# Patient Record
Sex: Male | Born: 1935
Health system: Southern US, Community
[De-identification: ages and names within clinical notes are randomized; demographics above are authoritative.]

## PROBLEM LIST (undated history)

## (undated) DIAGNOSIS — Z8546 Personal history of malignant neoplasm of prostate: Secondary | ICD-10-CM

## (undated) DIAGNOSIS — H9190 Unspecified hearing loss, unspecified ear: Secondary | ICD-10-CM

## (undated) DIAGNOSIS — E785 Hyperlipidemia, unspecified: Secondary | ICD-10-CM

## (undated) DIAGNOSIS — Z8673 Personal history of transient ischemic attack (TIA), and cerebral infarction without residual deficits: Secondary | ICD-10-CM

## (undated) DIAGNOSIS — Z8679 Personal history of other diseases of the circulatory system: Secondary | ICD-10-CM

## (undated) DIAGNOSIS — F419 Anxiety disorder, unspecified: Secondary | ICD-10-CM

## (undated) DIAGNOSIS — I10 Essential (primary) hypertension: Secondary | ICD-10-CM

## (undated) DIAGNOSIS — E790 Hyperuricemia without signs of inflammatory arthritis and tophaceous disease: Secondary | ICD-10-CM

## (undated) HISTORY — PX: OTHER SURGICAL HISTORY: SHX169

## (undated) HISTORY — PX: BACK SURGERY: SHX140

## (undated) HISTORY — PX: EVALUATION UNDER ANESTHESIA WITH HEMORRHOIDECTOMY: SHX5624

## (undated) HISTORY — DX: Hyperlipidemia, unspecified: E78.5

## (undated) HISTORY — PX: CHOLECYSTECTOMY: SHX55

## (undated) HISTORY — PX: PERINEAL PROSTATECTOMY: SUR1054

## (undated) HISTORY — PX: HERNIA REPAIR: SHX51

---

## 2006-05-16 ENCOUNTER — Ambulatory Visit: Payer: Self-pay | Admitting: Internal Medicine

## 2009-11-10 ENCOUNTER — Ambulatory Visit: Payer: Self-pay | Admitting: Gastroenterology

## 2009-11-12 LAB — PATHOLOGY REPORT

## 2011-08-08 ENCOUNTER — Emergency Department: Payer: Self-pay | Admitting: Emergency Medicine

## 2011-08-08 ENCOUNTER — Ambulatory Visit: Payer: Self-pay | Admitting: Internal Medicine

## 2011-08-08 LAB — PROTIME-INR
INR: 1
Prothrombin Time: 13.8 secs (ref 11.5–14.7)

## 2011-08-08 LAB — COMPREHENSIVE METABOLIC PANEL
Anion Gap: 9 (ref 7–16)
BUN: 16 mg/dL (ref 7–18)
Bilirubin,Total: 1.1 mg/dL — ABNORMAL HIGH (ref 0.2–1.0)
Calcium, Total: 9.3 mg/dL (ref 8.5–10.1)
Creatinine: 1.2 mg/dL (ref 0.60–1.30)
Glucose: 96 mg/dL (ref 65–99)
Osmolality: 277 (ref 275–301)
SGOT(AST): 36 U/L (ref 15–37)
SGPT (ALT): 30 U/L
Sodium: 138 mmol/L (ref 136–145)
Total Protein: 8.5 g/dL — ABNORMAL HIGH (ref 6.4–8.2)

## 2011-08-08 LAB — CBC
HCT: 52.4 % — ABNORMAL HIGH (ref 40.0–52.0)
MCH: 30.3 pg (ref 26.0–34.0)
MCV: 90 fL (ref 80–100)
Platelet: 193 10*3/uL (ref 150–440)
RBC: 5.8 10*6/uL (ref 4.40–5.90)
RDW: 13.2 % (ref 11.5–14.5)

## 2012-07-14 ENCOUNTER — Observation Stay: Payer: Self-pay | Admitting: Internal Medicine

## 2012-07-14 LAB — COMPREHENSIVE METABOLIC PANEL
Albumin: 3.4 g/dL (ref 3.4–5.0)
Alkaline Phosphatase: 58 U/L (ref 50–136)
Anion Gap: 8 (ref 7–16)
BUN: 31 mg/dL — ABNORMAL HIGH (ref 7–18)
Co2: 24 mmol/L (ref 21–32)
Creatinine: 1.37 mg/dL — ABNORMAL HIGH (ref 0.60–1.30)
EGFR (Non-African Amer.): 49 — ABNORMAL LOW
Glucose: 137 mg/dL — ABNORMAL HIGH (ref 65–99)
Osmolality: 288 (ref 275–301)
Potassium: 4.1 mmol/L (ref 3.5–5.1)
SGOT(AST): 33 U/L (ref 15–37)
SGPT (ALT): 36 U/L (ref 12–78)
Sodium: 140 mmol/L (ref 136–145)
Total Protein: 7.5 g/dL (ref 6.4–8.2)

## 2012-07-14 LAB — CBC
HCT: 53.7 % — ABNORMAL HIGH (ref 40.0–52.0)
MCH: 29.9 pg (ref 26.0–34.0)
MCV: 90 fL (ref 80–100)
RBC: 5.99 10*6/uL — ABNORMAL HIGH (ref 4.40–5.90)

## 2012-07-14 LAB — URINALYSIS, COMPLETE
Glucose,UR: NEGATIVE mg/dL (ref 0–75)
Ph: 5 (ref 4.5–8.0)
RBC,UR: 3 /HPF (ref 0–5)
Specific Gravity: 1.032 (ref 1.003–1.030)
WBC UR: 2 /HPF (ref 0–5)

## 2012-07-15 LAB — COMPREHENSIVE METABOLIC PANEL
Alkaline Phosphatase: 43 U/L — ABNORMAL LOW (ref 50–136)
Anion Gap: 6 — ABNORMAL LOW (ref 7–16)
Bilirubin,Total: 0.4 mg/dL (ref 0.2–1.0)
Calcium, Total: 7.2 mg/dL — ABNORMAL LOW (ref 8.5–10.1)
Co2: 24 mmol/L (ref 21–32)
Creatinine: 1.19 mg/dL (ref 0.60–1.30)
Glucose: 94 mg/dL (ref 65–99)
SGOT(AST): 29 U/L (ref 15–37)
SGPT (ALT): 26 U/L (ref 12–78)
Sodium: 140 mmol/L (ref 136–145)
Total Protein: 5.6 g/dL — ABNORMAL LOW (ref 6.4–8.2)

## 2012-07-15 LAB — CBC WITH DIFFERENTIAL/PLATELET
Eosinophil %: 0.1 %
HGB: 14.7 g/dL (ref 13.0–18.0)
Lymphocyte #: 1.4 10*3/uL (ref 1.0–3.6)
Lymphocyte %: 31.7 %
MCH: 29.6 pg (ref 26.0–34.0)
MCHC: 33.3 g/dL (ref 32.0–36.0)
Monocyte #: 0.6 x10 3/mm (ref 0.2–1.0)
Monocyte %: 13 %
RBC: 4.98 10*6/uL (ref 4.40–5.90)
WBC: 4.3 10*3/uL (ref 3.8–10.6)

## 2012-09-15 ENCOUNTER — Inpatient Hospital Stay: Payer: Self-pay | Admitting: Surgery

## 2012-09-15 LAB — URINALYSIS, COMPLETE
Glucose,UR: NEGATIVE mg/dL (ref 0–75)
Leukocyte Esterase: NEGATIVE
Nitrite: NEGATIVE
Ph: 5 (ref 4.5–8.0)
Protein: 30
Specific Gravity: 1.028 (ref 1.003–1.030)
Squamous Epithelial: NONE SEEN

## 2012-09-15 LAB — COMPREHENSIVE METABOLIC PANEL
Albumin: 3.3 g/dL — ABNORMAL LOW (ref 3.4–5.0)
Alkaline Phosphatase: 69 U/L (ref 50–136)
Anion Gap: 7 (ref 7–16)
Co2: 24 mmol/L (ref 21–32)
Glucose: 113 mg/dL — ABNORMAL HIGH (ref 65–99)
Osmolality: 277 (ref 275–301)
Potassium: 4 mmol/L (ref 3.5–5.1)
SGOT(AST): 33 U/L (ref 15–37)
Total Protein: 7.8 g/dL (ref 6.4–8.2)

## 2012-09-15 LAB — CBC
HCT: 45.4 % (ref 40.0–52.0)
HGB: 15.1 g/dL (ref 13.0–18.0)
MCHC: 33.2 g/dL (ref 32.0–36.0)
MCV: 88 fL (ref 80–100)
Platelet: 177 10*3/uL (ref 150–440)
RBC: 5.14 10*6/uL (ref 4.40–5.90)
RDW: 13 % (ref 11.5–14.5)
WBC: 13.4 10*3/uL — ABNORMAL HIGH (ref 3.8–10.6)

## 2012-09-15 LAB — LIPASE, BLOOD: Lipase: 145 U/L (ref 73–393)

## 2012-09-16 LAB — CBC WITH DIFFERENTIAL/PLATELET
Basophil %: 0.3 %
Eosinophil #: 0.1 10*3/uL (ref 0.0–0.7)
HGB: 13.4 g/dL (ref 13.0–18.0)
Lymphocyte %: 13.9 %
MCHC: 34.2 g/dL (ref 32.0–36.0)
MCV: 88 fL (ref 80–100)
Monocyte #: 0.8 x10 3/mm (ref 0.2–1.0)
Neutrophil #: 7.2 10*3/uL — ABNORMAL HIGH (ref 1.4–6.5)
Platelet: 154 10*3/uL (ref 150–440)
RBC: 4.47 10*6/uL (ref 4.40–5.90)
RDW: 13.1 % (ref 11.5–14.5)

## 2012-09-16 LAB — BASIC METABOLIC PANEL
Anion Gap: 8 (ref 7–16)
BUN: 22 mg/dL — ABNORMAL HIGH (ref 7–18)
Chloride: 107 mmol/L (ref 98–107)
Creatinine: 1.33 mg/dL — ABNORMAL HIGH (ref 0.60–1.30)
EGFR (African American): 59 — ABNORMAL LOW
Osmolality: 279 (ref 275–301)
Sodium: 138 mmol/L (ref 136–145)

## 2012-09-17 LAB — COMPREHENSIVE METABOLIC PANEL
Anion Gap: 10 (ref 7–16)
BUN: 13 mg/dL (ref 7–18)
Chloride: 107 mmol/L (ref 98–107)
Creatinine: 1.16 mg/dL (ref 0.60–1.30)
EGFR (African American): >60
EGFR (Non-African Amer.): >60
Glucose: 97 mg/dL (ref 65–99)
Osmolality: 278 (ref 275–301)
Potassium: 4.7 mmol/L (ref 3.5–5.1)
Sodium: 139 mmol/L (ref 136–145)
Total Protein: 6 g/dL — ABNORMAL LOW (ref 6.4–8.2)

## 2012-09-17 LAB — CBC WITH DIFFERENTIAL/PLATELET
Basophil #: 0 10*3/uL (ref 0.0–0.1)
Basophil %: 0.2 %
HCT: 39 % — ABNORMAL LOW (ref 40.0–52.0)
HGB: 13.3 g/dL (ref 13.0–18.0)
Lymphocyte #: 0.7 10*3/uL — ABNORMAL LOW (ref 1.0–3.6)
MCHC: 34.1 g/dL (ref 32.0–36.0)
MCV: 88 fL (ref 80–100)
Neutrophil #: 7.9 10*3/uL — ABNORMAL HIGH (ref 1.4–6.5)
Neutrophil %: 85 %
Platelet: 185 10*3/uL (ref 150–440)
WBC: 9.2 10*3/uL (ref 3.8–10.6)

## 2012-09-19 LAB — BASIC METABOLIC PANEL
Anion Gap: 9 (ref 7–16)
BUN: 13 mg/dL (ref 7–18)
Calcium, Total: 8.7 mg/dL (ref 8.5–10.1)
Chloride: 106 mmol/L (ref 98–107)
Co2: 24 mmol/L (ref 21–32)
Creatinine: 1.08 mg/dL (ref 0.60–1.30)
EGFR (African American): >60
Potassium: 4 mmol/L (ref 3.5–5.1)
Sodium: 139 mmol/L (ref 136–145)

## 2012-09-19 LAB — CBC WITH DIFFERENTIAL/PLATELET
Basophil #: 0 10*3/uL (ref 0.0–0.1)
Basophil %: 0.3 %
Eosinophil %: 3.9 %
Lymphocyte #: 1 10*3/uL (ref 1.0–3.6)
Lymphocyte %: 9.8 %
MCH: 30.1 pg (ref 26.0–34.0)
MCHC: 34.2 g/dL (ref 32.0–36.0)
MCV: 88 fL (ref 80–100)
Neutrophil #: 7.7 10*3/uL — ABNORMAL HIGH (ref 1.4–6.5)
Neutrophil %: 79.1 %
RBC: 4.39 10*6/uL — ABNORMAL LOW (ref 4.40–5.90)
RDW: 13.2 % (ref 11.5–14.5)
WBC: 9.7 10*3/uL (ref 3.8–10.6)

## 2012-09-21 LAB — CULTURE, BLOOD (SINGLE)

## 2012-09-22 LAB — CBC WITH DIFFERENTIAL/PLATELET
Basophil #: 0 10*3/uL (ref 0.0–0.1)
Eosinophil #: 0.2 10*3/uL (ref 0.0–0.7)
Eosinophil %: 2.5 %
HCT: 34.9 % — ABNORMAL LOW (ref 40.0–52.0)
HGB: 11.8 g/dL — ABNORMAL LOW (ref 13.0–18.0)
Lymphocyte %: 12.9 %
Monocyte #: 0.8 x10 3/mm (ref 0.2–1.0)
Monocyte %: 8.5 %
Neutrophil %: 75.8 %
RBC: 4.03 10*6/uL — ABNORMAL LOW (ref 4.40–5.90)
RDW: 13.2 % (ref 11.5–14.5)

## 2012-09-22 LAB — BASIC METABOLIC PANEL
Calcium, Total: 8.4 mg/dL — ABNORMAL LOW (ref 8.5–10.1)
Chloride: 105 mmol/L (ref 98–107)
EGFR (African American): >60
EGFR (Non-African Amer.): >60
Glucose: 133 mg/dL — ABNORMAL HIGH (ref 65–99)
Osmolality: 279 (ref 275–301)
Potassium: 3.3 mmol/L — ABNORMAL LOW (ref 3.5–5.1)

## 2012-09-30 ENCOUNTER — Emergency Department: Payer: Self-pay | Admitting: Emergency Medicine

## 2012-11-25 DIAGNOSIS — E785 Hyperlipidemia, unspecified: Secondary | ICD-10-CM | POA: Insufficient documentation

## 2012-11-25 DIAGNOSIS — I10 Essential (primary) hypertension: Secondary | ICD-10-CM | POA: Insufficient documentation

## 2012-11-25 HISTORY — DX: Hyperlipidemia, unspecified: E78.5

## 2013-01-21 ENCOUNTER — Ambulatory Visit: Payer: Self-pay | Admitting: Surgery

## 2013-01-21 LAB — CREATININE, SERUM
EGFR (African American): >60
EGFR (Non-African Amer.): 56 — ABNORMAL LOW

## 2013-02-12 ENCOUNTER — Ambulatory Visit: Payer: Self-pay | Admitting: Surgery

## 2013-02-12 DIAGNOSIS — I10 Essential (primary) hypertension: Secondary | ICD-10-CM

## 2013-02-12 LAB — BASIC METABOLIC PANEL
Anion Gap: 4 — ABNORMAL LOW (ref 7–16)
Calcium, Total: 9.5 mg/dL (ref 8.5–10.1)
Co2: 27 mmol/L (ref 21–32)
Creatinine: 1.06 mg/dL (ref 0.60–1.30)
EGFR (African American): >60
EGFR (Non-African Amer.): >60
Glucose: 96 mg/dL (ref 65–99)
Osmolality: 279 (ref 275–301)
Sodium: 139 mmol/L (ref 136–145)

## 2013-02-12 LAB — CBC WITH DIFFERENTIAL/PLATELET
Basophil %: 0.5 %
Eosinophil %: 2.2 %
HGB: 16.4 g/dL (ref 13.0–18.0)
Lymphocyte #: 1.7 10*3/uL (ref 1.0–3.6)
Lymphocyte %: 25.2 %
MCHC: 33.9 g/dL (ref 32.0–36.0)
MCV: 88 fL (ref 80–100)
Monocyte %: 7.9 %
Neutrophil #: 4.2 10*3/uL (ref 1.4–6.5)
RDW: 13.9 % (ref 11.5–14.5)
WBC: 6.6 10*3/uL (ref 3.8–10.6)

## 2013-02-19 ENCOUNTER — Ambulatory Visit: Payer: Self-pay | Admitting: Surgery

## 2013-02-20 LAB — PLATELET COUNT: Platelet: 175 10*3/uL (ref 150–440)

## 2013-02-22 LAB — PATHOLOGY REPORT

## 2014-01-07 DIAGNOSIS — Z8546 Personal history of malignant neoplasm of prostate: Secondary | ICD-10-CM | POA: Insufficient documentation

## 2014-01-07 DIAGNOSIS — Z8679 Personal history of other diseases of the circulatory system: Secondary | ICD-10-CM | POA: Insufficient documentation

## 2014-01-07 DIAGNOSIS — Z8673 Personal history of transient ischemic attack (TIA), and cerebral infarction without residual deficits: Secondary | ICD-10-CM | POA: Insufficient documentation

## 2014-05-07 ENCOUNTER — Ambulatory Visit: Payer: Self-pay | Admitting: Surgery

## 2014-05-22 ENCOUNTER — Ambulatory Visit: Payer: Self-pay | Admitting: Surgery

## 2014-07-18 NOTE — Op Note (Signed)
PATIENT NAME:  KARIS, EMIG MR#:  [id number] DATE OF BIRTH:  December 03, 1933  DICTATING PHYSICIAN: Harrell Gave A. Ladean Steinmeyer, MD  DATE OF PROCEDURE:  02/19/2013  PREOPERATIVE DIAGNOSIS: Incisional hernia.   POSTOPERATIVE DIAGNOSIS: Incisional hernia, approximately 5 x 7 cm.   PROCEDURE PERFORMED: Open incisional hernia repair with mesh.   ESTIMATED BLOOD LOSS: 20 mL.   COMPLICATIONS: None.   SPECIMEN: Hernia sac.   IMPLANT: A 10 x 15 piece of Physiomesh.   DRAINS: One JP drain in incisional hernia cavity.   INDICATION FOR SURGERY: Mr. Gilford Rile is a pleasant 81 year old who recently underwent a exploratory laparotomy and small bowel resection for perforated small bowel diverticulitis. He had an uncomfortable palpable reducible mass in his superior aspect of the incision. I thus brought him to the operating room for hernia repair.   DETAILS OF PROCEDURE: As follows: Informed consent was obtained. Mr. Renne was brought to the operating room suite. He was laid supine on the operating table. He was induced, endotracheal tube was placed, general anesthesia was administered. His abdomen was then prepped and draped in standard surgical fashion. A timeout was then performed, correctly identifying the patient name, operative site and procedure to be performed. An incision was made through the superior portion of his previous incision. His hernia sac was soon encountered. It was encircled. It was opened. There were loops of small bowel adhesed to the sac. These were reduced. The sac was then excised. The underside of the fascia was then cleared off, and small flaps were made to allow insertion of Physiomesh. The defect was 5 x 7 cm. A 10 x 15 piece of Physiomesh was placed into the abdomen. It was trimmed at both long ends. These were sutured in place, and the mesh was placed as an underlay with transfascial 0 Prolene sutures interrupted around the circumference of the mesh. The wound was then closed with a  running 0 Prolene. A JP drain was placed in the subcutaneous tissue to prevent seroma formation, and this was sutured in place with a 3-0 Prolene. Staples were then used to complete the wound. A sterile dressing was then placed over the wound. The patient was then awoken, extubated and brought to the postanesthesia care unit. There were no immediate complications. Needle, sponge and instrument counts were correct at the end of the procedure.   ____________________________ Glena Norfolk. Chrislynn Mosely, MD cal:lb D: 02/20/2013 07:41:17 ET T: 02/20/2013 08:14:16 ET JOB#: [id number]  cc: Harrell Gave A. Betzabeth Derringer, MD, <Dictator> Floyde Parkins MD ELECTRONICALLY SIGNED 02/23/2013 19:38

## 2014-07-18 NOTE — Op Note (Signed)
PATIENT NAME:  Alex Malone, Alex Malone MR#:  [id number] DATE OF BIRTH:  1933/08/18  DATE OF PROCEDURE:  09/16/2012  PREOPERATIVE DIAGNOSIS:  Small bowel diverticulitis.   POSTOPERATIVE DIAGNOSIS: Small bowel diverticulitis with mesenteric abscess.   PROCEDURE PERFORMED  1.  Exploratory laparotomy.  2.  Small bowel resection with primary anastomosis.   SURGEON:  Marlyce Huge, MD  ESTIMATED BLOOD LOSS: 200 mL.    COMPLICATIONS:  None.   SPECIMEN: Small bowel diverticula as well as an additional piece of adjacent small bowel.   ANESTHESIA: General endotracheal.   INDICATION FOR SURGERY: Mr. Collington is a pleasant 81 year old male who presents with 3 days of suprapubic and right lower quadrant pain. He was noted to have an elevated white cell count and what looked like a contained perforation of his small bowel. I did not think that this would heal as a colonic diverticulitis and therefore brought him to the Operating Room for exploratory laparotomy and probable small bowel resection.   DETAILS OF PROCEDURE: Mr. Pellot was brought to the Operating Room suite. He was induced, endotracheal tube was placed, general anesthesia was administered. A timeout was then performed correctly identifying the patient name, operative site and procedure to be performed. After his bowel was prepped and draped in standard surgical fashion, a supraumbilical incision was made. This was deepened down to the fascia. The fascia was incised. The peritoneum was entered. I immediately felt a large loop of distended small bowel with some necrosis in the mesentery. I evaluated his small bowel and found a single perforated tic diverticula which, when I placed a right angle clamp through it, found myself within the bowel lumen. I then found areas of relatively noninflamed bowel proximal and distal and ligated with a GIA 75 stapler with blue load.  I then took the mesentery with sequential Kelly clamps. I then ensured the  mesoappendix was hemostatic and proceeded to prepare for anastomosis. I still felt that the proximal piece of bowel was still relatively inflamed. Therefore, I took another approximately 4 cm section again using a GIA stapler to transect the bowel and Kelly clamps and suture ties and suture ligatures to clamp the mesentery. I then approximated the 2 cut ends with two 3-0 silk sutures proximally and distally. I then removed the corners of the ligated bowel ends and made a common channel using a GIA 75 stapler. I then closed the common enterotomy using a TX 60 stapler. I then placed a cross stitch. The lumen was very wide and the anastomosis looked good. I then attempted to close the mesenteric defect, however, it was extremely inflamed and thickened and friable on my initial attempt to try to bring the mesentery together, caused bleeding which was difficult to control and I realized that I would not be able to close the mesenteric defect. Once hemostasis was obtained, I placed Surgicel over the cut mesenteric ends and then returned the bowel into the abdomen. I then irrigated the bowel with approximately 3 liters of normal saline and then closed the fasciotomy using a running looped #1 PDS which was tied from 1 end and tied at the other. I then used staples to close the skin. A sterile dressing was then placed on the wound. The patient was then awoken, extubated and brought to the postanesthesia care unit. There were no immediate complications. Needle, sponge and instrument counts were correct at the end of the procedure.    ____________________________ Glena Norfolk. Enjoli Tidd, MD cal:cs D: 09/16/2012 15:29:00 ET T: 09/16/2012  20:37:09 ET JOB#: [id number]  cc: Harrell Gave A. Yona Kosek, MD, <Dictator> Floyde Parkins MD ELECTRONICALLY SIGNED 09/24/2012 19:31

## 2014-07-18 NOTE — Consult Note (Signed)
PATIENT NAME:  Alex Malone, Alex Malone MR#:  [id number] DATE OF BIRTH:  08-06-1933  GASTROENTEROLOGY CONSULTATION  DATE OF ADMISSION: 07/14/2012  DATE OF CONSULTATION:  07/15/2012  CONSULTING SERVICE:  Gastroenterology.  CONSULTING PHYSICIAN:  Lucilla Lame, MD  REASON FOR CONSULTATION:   Abdominal pain, nausea, and vomiting, with diarrhea.  HISTORY OF PRESENT ILLNESS: This is a 81 year old gentleman who reports that he had nausea and vomiting that started yesterday, and he was having diarrhea at the time. The patient states that his diarrhea had resolved but he came to the Emergency Room because of the continued nausea and vomiting. Upon admission the nausea and vomiting and abdominal pain had resolved and he had no further nausea and vomiting today. He does state that when he ate he started to have the diarrhea again.   He also reports that this morning he called his wife, and she is now suffering from the same thing he is having. He denies any recent travel or eating anything out of the ordinary. His pain was in the left lower quadrant, and he had a CT scan of  the abdomen that showed diverticulosis on admission, without any diverticulitis.  PAST MEDICAL HISTORY: Hypertension, hyperlipidemia, cerebral aneurysm rupture with bleed status post surgical repair of the bleed.  MEDICATIONS: Zocor, Zestril, Valium, aspirin.  ALLERGIES: No known drug allergies.  SOCIAL HISTORY: No tobacco, alcohol, or drugs.  FAMILY HISTORY: Noncontributory.  REVIEW OF SYSTEMS: Nausea, vomiting, diarrhea. The 10-point review of systems was negative except what is stated above.   PHYSICAL EXAMINATION : GENERAL: The patient is sitting in bed, in no apparent distress. No complaints except diarrhea. VITAL SIGNS:  Temperature 98.4, respirations 18, pulse 87, blood pressure 108/69, pulse oximetry 93%. HEENT: Normocephalic, atraumatic. Extraocular movements intact. Pupils equal, round, and reactive to light and accommodation,  without JVD, without lymphadenopathy. LUNGS: Clear to auscultation bilaterally. HEART: Regular rate and rhythm, without murmurs, rubs, or gallops.  ABDOMEN: Soft. Some mild tenderness in the left lower quadrant, without rebound, without guarding, without hepatosplenomegaly. EXTREMITIES:  Without cyanosis, clubbing, or edema. MUSCULOSKELETAL: Without any deformities. NEUROLOGICAL EXAM: Grossly intact. PSYCHIATRIC: Shows the patient alert and oriented x 3.  SKIN: Without any rashes or lesions.  ANCILLARY SERVICES: White cell count 4.3 this morning. Hemoglobin 14.7.   LFTs normal.  ASSESSMENT AND PLAN: This patient is a 81 year old gentleman with what appears to be viral gastroenteritis, with symptoms that have resolved. He has a sick contact with his wife, who developed the same symptoms this morning.  The patient should be treated with conservative management and is no longer having the nausea and vomiting, and just having diarrhea. The patient needs no further GI workup and should be treated with supportive care.  Thank you very much for involving me in the care of this patient. If you have any questions please do not hesitate to call. I will sign off at this time.    ____________________________ Lucilla Lame, MD dw:dm D: 07/15/2012 12:39:03 ET T: 07/15/2012 13:55:46 ET JOB#: [id number]  cc: Lucilla Lame, MD, <Dictator> Lucilla Lame MD ELECTRONICALLY SIGNED 07/18/2012 7:16

## 2014-07-18 NOTE — Discharge Summary (Signed)
PATIENT NAME:  Alex Malone, Alex Malone MR#:  [id number] DATE OF BIRTH:  Jun 19, 1933  DATE OF ADMISSION:  02/19/2013 DATE OF DISCHARGE:  02/21/2013  DISCHARGE DIAGNOSES: 1.  Ventral hernia following exploratory laparotomy with small bowel resection for small bowel diverticulitis.  2.  History of prostate cancer.  3.  History of cholecystectomy.  4.  History of back surgery.  5.  History of cerebral aneurysm and hemorrhage.  6.  Hypertension.  7.  Hyperlipidemia.   DISCHARGE MEDICATIONS: 1.  ASA 325 mg p.o. daily.  2.  Lisinopril 10 mg p.o. daily.  3.  Tylenol 1000 q. 6 hours p.r.n. pain.  4.  Zocor 20 mg p.o. at bedtime.  5.  Senna 8.6 mg p.o. at bedtime. 6.  Diazepam 2 mg p.o. t.i.d. p.r.n. anxiety.  7.  Percocet 1 to 2 tabs p.o. q. 4 hours p.r.n. pain.   INDICATION FOR ADMISSION: Alex Malone is a pleasant 81 year old male who underwent ventral hernia repair with mesh placement for a hernia following exploratory laparotomy for perforated diverticulitis.   HOSPITAL COURSE: Alex Malone was admitted postoperatively. He was initially given clear liquid diet and he was later advanced to a regular diet as his nausea improved. He was also transitioned from IV pain meds which he had initially to p.o. pain medications. At the time of discharge, he was taking good p.o. with good p.o. pain control and voiding without difficulty.   DISCHARGE INSTRUCTIONS: Alex Malone is to call or return to the ED if he has increased pain, nausea, vomiting, redness, or drainage from incision. He also has a JP drain, which he will record and bring the volumes to a followup appointment   ____________________________ Glena Norfolk. Stevin Bielinski, MD cal:sb D: 03/05/2013 13:37:08 ET T: 03/05/2013 13:52:35 ET JOB#: [id number]  cc: Harrell Gave A. Ruby Dilone, MD, <Dictator> Floyde Parkins MD ELECTRONICALLY SIGNED 03/07/2013 11:36

## 2014-07-18 NOTE — Consult Note (Signed)
Brief Consult Note: Diagnosis: Patient with nausea and vomiting at home and then came to ER. No further vomiting but had diarrhea this am after starting PO.s.   Patient was seen by consultant.   Consult note dictated.   Comments: N/V and diarrhea that started yesterday. Patient spoke to his wife this morning and she is having the same symptoms today. Likely gastroenteritis. Supportive treatment.  Electronic Signatures: Lucilla Lame (MD)  (Signed 20-Apr-14 10:27)  Authored: Brief Consult Note   Last Updated: 20-Apr-14 10:27 by Lucilla Lame (MD)

## 2014-07-18 NOTE — H&P (Signed)
PATIENT NAME:  Alex Malone, Alex Malone MR#:  [id number] DATE OF BIRTH:  1933-10-18  DATE OF ADMISSION:  07/14/2012  REFERRING PHYSICIAN: Sheryl L. Benjaman Lobe, MD   FAMILY PHYSICIAN: Hewitt Blade. Sarina Ser, MD  REASON FOR ADMISSION: Abdominal pain.   HISTORY OF PRESENT ILLNESS: The patient is a 81 year old male with a history of essential hypertension, hyperlipidemia and previous cerebral bleed, who presents to the Emergency Room with a 2-day history of abdominal pain, mostly in the left lower quadrant, associated with intractable nausea, vomiting and diarrhea. He states that he is unable to keep liquid or solids down. Having worsening pain in his left lower quadrant. He was brought to the Emergency Room, where he was found to be hypotensive. CT of the abdomen showed diverticulosis without obvious evidence of diverticulitis. However, the patient remains hypotensive and unable to eat and is now admitted for further evaluation.   PAST MEDICAL HISTORY:  1. Benign hypertension.  2. Hyperlipidemia.  3. History of cerebral aneurysm rupture with bleed.  4. Status post surgical repair of #3.   MEDICATIONS:  1. Zocor 20 mg p.o. q.h.s.  2. Zestril 10 mg p.o. daily.  3. Valium 2 mg p.o. t.i.d.  4. Aspirin 325 mg p.o. daily.   ALLERGIES: No known drug allergies.   SOCIAL HISTORY: Negative for alcohol or tobacco abuse.   FAMILY HISTORY: Positive for hypertension and stroke. Negative for prostate or colon cancer. Negative for coronary artery disease.   REVIEW OF SYSTEMS:  CONSTITUTIONAL: No fever or change in weight.  EYES: No blurred or double vision. No glaucoma.  ENT: No tinnitus or hearing loss. No nasal discharge or bleeding. No difficulty swallowing.  RESPIRATORY: No cough or wheezing. Denies hemoptysis.  CARDIOVASCULAR: No chest pain or orthopnea. No palpitations or syncope.  GASTROINTESTINAL: Nausea, vomiting, diarrhea and abdominal pain as per HPI. GENITOURINARY: No dysuria or hematuria. No  incontinence.  ENDOCRINE: No polyuria or polydipsia. No heat or cold intolerance.  HEMATOLOGIC: The patient denies anemia, easy bruising or bleeding.  LYMPHATIC: No swollen glands.  MUSCULOSKELETAL: The patient denies pain in his neck, back, shoulders, knees or hips. No gout.  NEUROLOGIC: No numbness, although he does have weakness. Denies migraines, stroke or seizures.  PSYCHIATRIC: The patient denies anxiety, insomnia or depression.   PHYSICAL EXAMINATION:  GENERAL: The patient is in no acute distress.  VITAL SIGNS: Currently remarkable for a blood pressure of 84/56 with a heart rate of 84 and a respiratory rate of 18. He is afebrile.  HEENT: Normocephalic, atraumatic. Pupils equally round and reactive to light and accommodation. Extraocular movements are intact. Sclerae are nonicteric. Conjunctivae are clear. Oropharynx is dry but clear.  NECK: Supple without JVD. No adenopathy or thyromegaly is noted.  LUNGS: Clear to auscultation and percussion without wheezes, rales or rhonchi. No dullness.  CARDIAC: Regular rate and rhythm with normal S1, S2. No significant rubs, murmurs or gallops. PMI is nondisplaced. Chest wall is nontender.  ABDOMEN: Soft with some mild tenderness in the left lower quadrant. No rebound or guarding. Hyperactive bowel sounds. No organomegaly or masses were appreciated. No hernias or bruits were noted.  EXTREMITIES: Without clubbing, cyanosis or edema. Pulses were 2+ bilaterally.  SKIN: Warm and dry without rash or lesions.  NEUROLOGIC: Revealed cranial nerves II through XII grossly intact. Deep tendon reflexes were symmetric. Motor and sensory exam is nonfocal.  PSYCHIATRIC: Revealed a patient who was alert and oriented to person, place and time. He was cooperative and used good judgment.   LABORATORY  DATA: Glucose was 137 with a BUN of 31 and a creatinine of 1.37 with a GFR of 49. Sodium 140 with a potassium of 4.1. His bilirubin was normal. Lipase was 69. White count  was 4.8 with a hemoglobin of 17.9 and a platelet count of 182,000. Urinalysis was essentially unremarkable. CT of the abdomen revealed extensive diverticulosis and a normal-appearing appendix. EKG revealed sinus rhythm with no acute ischemic changes.   ASSESSMENT:  1. Abdominal pain worrisome for diverticulitis despite CT scan findings.  2. Intractable nausea and vomiting.  3. Dehydration.  4. Hypotension.   PLAN: The patient will be observed on the floor with IV fluids and empiric IV Cipro and IV Flagyl. Will use Zofran as needed for nausea and vomiting and morphine as needed for pain. Will consult GI in regard to the patient's abdominal pain. Will hold his lisinopril because of his hypotension. Follow up routine labs in the morning. Further treatment and evaluation will depend upon the patient's progress.   TOTAL TIME SPENT ON THIS PATIENT: 45 minutes.    ____________________________ Leonie Douglas Doy Hutching, MD jds:OSi D: 07/14/2012 14:23:41 ET T: 07/14/2012 14:37:09 ET JOB#: [id number]  cc: Leonie Douglas. Doy Hutching, MD, <Dictator> John B. Sarina Ser, MD Elloise Roark Lennice Sites MD ELECTRONICALLY SIGNED 07/14/2012 19:26

## 2014-07-18 NOTE — Discharge Summary (Signed)
PATIENT NAME:  Alex Malone, Alex Malone MR#:  [id number] DATE OF BIRTH:  27-Aug-1933  DATE OF ADMISSION:  09/15/2012 DATE OF DISCHARGE:  09/23/2012  DISCHARGE DIAGNOSES: 1.  Small bowel diverticulitis status post exploratory laparotomy, small bowel resection.  2.  History of prostate cancer, status post prostatectomy.  3.  History of cholecystectomy for biliary pancreatitis.  4.  History of back surgery.  5.  History of cerebral aneurysm and hemorrhage.  6.  Hypertension.  7.  Hyperlipidemia.   DISCHARGE MEDICATIONS: Aspirin 325 p.o. daily, lisinopril 10 mg p.o. daily. Zocor 20 mg p.o. daily.  Norco 1 tab p.o. q.4-6h. p.r.n. pain. Augmentin (Dictation Anomaly) 500 mg p.o. b.i.d. x5 days.   INDICATION FOR ADMISSION:  [name] is a pleasant 81 year old male who presented with abdominal pain, leukocytosis and a CT scan concerning for a small bowel diverticulitis with perforation.   HOSPITAL COURSE: [name] was admitted on June 21st. He underwent resuscitation and given IV antibiotics and underwent exploratory laparotomy with small bowel resection on 09/16/2012. Throughout his hospital course he initially developed an ileus but later his bowel function returned. He was transitioned from IV to p.o. pain meds which he is tolerating p.o. pain medications at the time of discharge. He was given antibiotics IV and later transitioned to p.o. and his diet was advanced as his bowel function remains. At the time of discharge [name] is taking good p.o. with good p.o. pain control, on oral antibiotics and on oral pain medication.   DISCHARGE INSTRUCTIONS: [name] is to call or return to the ED if has increased pain, nausea, vomiting, redness or drainage from incision. He is to follow up with me in approximately one week.   ____________________________ Glena Norfolk. Aiyannah Fayad, MD cal:dp D: 10/02/2012 13:38:00 ET T: 10/02/2012 14:22:10 ET JOB#: [id number]  cc: Harrell Gave A. Govani Radloff, MD,  <Dictator> Floyde Parkins MD ELECTRONICALLY SIGNED 10/03/2012 13:38

## 2014-07-18 NOTE — H&P (Signed)
   Subjective/Chief Complaint RLQ/suprapubic pain   History of Present Illness Alex Malone is a pleasant 81 yo M with a history of prior prostatectomy and cholecystectomy who presents with 3 days of intermittent suprapubic and RLQ pain.  He says that it began gradually 3 days ago and waxes and wanes.  Poor appetite.  Last PO yesterday.  Has not moved.  Has never had pain like this before.  Not worse with movement. Had colonoscopy in 2011 with resection of benign polyps.  Last BM today, normal.  No fevers, chills, night sweats, shortness of breath, cough, chest pain, nausea/vomiting, diarrhea   Past History H/o prostate cancer s/p open prostatectomy (approx 1995) H/o cholecystectomy for biliary pancreatitis 2001 H/o back surgery (20 y PTA) H/o cerebral aneurysm/hemorrhage 1 year ago HTN Hyperlipidemia   Past Medical Health Hypertension, Stroke   Past Med/Surgical Hx:  prostate cancer:   hyperlipidemia:   HTN:   prostatectomy:   back surgery:   cerebral aneurym repair:   ALLERGIES:  No Known Allergies:   Family and Social History:  Family History Coronary Artery Disease  Hypertension  Diabetes Mellitus  Cancer   Social History negative tobacco, negative ETOH, negative Illicit drugs   Place of Living Home - here with wife/daughter   Review of Systems:  Subjective/Chief Complaint RLQ/Suprapubic pain   Fever/Chills No   Cough No   Sputum No   Abdominal Pain Yes   Diarrhea No   Constipation No   Nausea/Vomiting No   SOB/DOE No   Chest Pain No   Dysuria No   Tolerating PT No   Tolerating Diet No   Physical Exam:  GEN well developed, well nourished, no acute distress   HEENT pale conjunctivae, PERRL, hearing intact to voice, good dentition   NECK No masses   RESP normal resp effort  clear BS  no use of accessory muscles   CARD regular rate  no murmur  no thrills  No LE edema   ABD positive tenderness  denies Flank Tenderness  no hernia  soft  hypoactive  BS  no Adominal Mass   EXTR negative cyanosis/clubbing, negative edema   SKIN normal to palpation, No rashes, No ulcers   NEURO cranial nerves intact, negative rigidity, negative tremor, L side weakness, follows commands   PSYCH A+O to time, place, person, good insight    Assessment/Admission Diagnosis Alex Malone is a pleasant 81 yo M with 3 days of RLQ and suprapubic pain, CT findings concerning for contained, perforated small bowel diverticulitis   Plan Will plan on admission/resuscitation.   Will have my colleague Dr. Pat Patrick evaluate patient for possible operative intervention tonight, likely in am post resuscitation.   Electronic Signatures: Floyde Parkins (MD)  (Signed 21-Jun-14 18:59)  Authored: CHIEF COMPLAINT and HISTORY, PAST MEDICAL/SURGIAL HISTORY, ALLERGIES, FAMILY AND SOCIAL HISTORY, REVIEW OF SYSTEMS, PHYSICAL EXAM, ASSESSMENT AND PLAN   Last Updated: 21-Jun-14 18:59 by Floyde Parkins (MD)

## 2014-07-27 NOTE — Op Note (Signed)
PATIENT NAME:  Alex Malone, Alex Malone MR#:  [id number] DATE OF BIRTH:  26-Jun-1933  DATE OF PROCEDURE:  05/22/2014  PREOPERATIVE DIAGNOSIS: Recurrent ventral hernia.   POSTOPERATIVE DIAGNOSIS:  Recurrent ventral hernia.   OPERATION: Robotic -assisted laparoscopic ventral hernia repair.   ANESTHESIA: General.   SURGEON: Rodena Goldmann.   OPERATIVE PROCEDURE: With the patient in the supine position after induction of appropriate general anesthesia the patient's abdomen was prepped with ChloraPrep and draped with sterile towels. The patient was appropriately positioned and padded. A left mid abdominal lateral incision was made and the abdomen cannulated under direct vision using the Visiport apparatus and was then filled with carbon dioxide for better exposure. Robotic ports were placed, two in the left upper quadrant and 1 in the left lower quadrant.  The robot was brought to the table, docked to the instruments. Instruments inserted under direct vision and placed in the site of the hernia defect and moved to the console. There were multiple adhesions between the bowel and previous hernia repair.  Physio MeSH had been used in the past and appeared to separated at the inferior portion of the repair.  The defect was approximately 3 cm in size.  There was a loop of bowel up into the defect. However, there were multiple loops of bowel adherent to the Physio MeSH.  Tedious dissection was required to take down all the loops of bowel and there were multiple areas where a small amount of mesh was left on the bowel.  The defect appeared to be otherwise unremarkable. The peritoneum was taken down from the defect.  I could not remove the edges of the defect closer together for primary closure because part of the defect was mesh. A piece of Bard Echo mesh 11 cm was brought to the table and inserted into the assistance port placed in position with the suture passer needle and the balloon inflated. The mesh was then sutured in  place under direct vision using a V-Loc suture. The repair appeared to be satisfactory. The echo portion of the mesh was removed. The abdomen appeared to be otherwise unremarkable. The instruments were withdrawn under direct vision. The robot undocked and moved away from the table. The patient position ports were removed after de-sufflation and the skin edges closed with 5-0 nylon. The area was infiltrated with 0.25% Marcaine for postoperative pain control. Sterile dressings were applied. The patient was returned to the recovery room, having tolerated the procedure well. Sponge, instrument and needle counts were correct x2 in the Operating Room.    ____________________________ Alex Maze, MD rle:at D: 05/22/2014 10:21:48 ET T: 05/22/2014 18:35:06 ET JOB#: [id number]  cc: Alex Maze, MD, <Dictator> John B. Sarina Ser, MD Rodena Goldmann MD ELECTRONICALLY SIGNED 05/23/2014 17:26

## 2016-05-18 ENCOUNTER — Other Ambulatory Visit: Payer: Self-pay | Admitting: Internal Medicine

## 2016-05-18 DIAGNOSIS — R519 Headache, unspecified: Secondary | ICD-10-CM

## 2016-05-18 DIAGNOSIS — R51 Headache: Principal | ICD-10-CM

## 2016-05-27 ENCOUNTER — Ambulatory Visit
Admission: RE | Admit: 2016-05-27 | Discharge: 2016-05-27 | Disposition: A | Discharge status: Home / Self Care | Payer: Medicare Other | Source: Ambulatory Visit | Attending: Internal Medicine | Admitting: Internal Medicine

## 2016-05-27 DIAGNOSIS — R519 Headache, unspecified: Secondary | ICD-10-CM

## 2016-05-27 DIAGNOSIS — R51 Headache: Secondary | ICD-10-CM | POA: Diagnosis not present

## 2016-05-27 MED ORDER — GADOBENATE DIMEGLUMINE 529 MG/ML IV SOLN
15.0000 mL | Freq: Once | INTRAVENOUS | Status: AC | PRN
  Administered 2016-05-27: 15 mL via INTRAVENOUS

## 2016-05-30 ENCOUNTER — Ambulatory Visit: Payer: Medicare Other

## 2016-08-11 ENCOUNTER — Ambulatory Visit
Admission: EM | Admit: 2016-08-11 | Discharge: 2016-08-11 | Disposition: A | Discharge status: Home / Self Care | Payer: Medicare Other | Attending: Family Medicine | Admitting: Family Medicine

## 2016-08-11 ENCOUNTER — Other Ambulatory Visit: Payer: Self-pay

## 2016-08-11 DIAGNOSIS — K432 Incisional hernia without obstruction or gangrene: Secondary | ICD-10-CM | POA: Diagnosis not present

## 2016-08-11 DIAGNOSIS — R1110 Vomiting, unspecified: Secondary | ICD-10-CM | POA: Diagnosis not present

## 2016-08-11 DIAGNOSIS — E790 Hyperuricemia without signs of inflammatory arthritis and tophaceous disease: Secondary | ICD-10-CM | POA: Insufficient documentation

## 2016-08-11 DIAGNOSIS — R1013 Epigastric pain: Secondary | ICD-10-CM

## 2016-08-11 DIAGNOSIS — R1111 Vomiting without nausea: Secondary | ICD-10-CM

## 2016-08-11 HISTORY — DX: Hyperuricemia without signs of inflammatory arthritis and tophaceous disease: E79.0

## 2016-08-11 HISTORY — DX: Personal history of transient ischemic attack (TIA), and cerebral infarction without residual deficits: Z86.73

## 2016-08-11 HISTORY — DX: Essential (primary) hypertension: I10

## 2016-08-11 HISTORY — DX: Personal history of other diseases of the circulatory system: Z86.79

## 2016-08-11 HISTORY — DX: Personal history of malignant neoplasm of prostate: Z85.46

## 2016-08-11 HISTORY — DX: Hyperlipidemia, unspecified: E78.5

## 2016-08-11 MED ORDER — HYDROCODONE-ACETAMINOPHEN 5-325 MG PO TABS
0.5000 | ORAL_TABLET | Freq: Four times a day (QID) | ORAL | 0 refills | Status: DC | PRN
Start: 2016-08-11 — End: 2016-08-17

## 2016-08-11 MED ORDER — ONDANSETRON 8 MG PO TBDP: 8.0000 mg | ORAL_TABLET | Freq: Three times a day (TID) | ORAL | 0 refills | Status: DC | PRN

## 2016-08-11 NOTE — ED Provider Notes (Signed)
CSN: [id number]     Arrival date & time 08/11/16  4235 History   First MD Initiated Contact with Patient 08/11/16 1003     Chief Complaint  Patient presents with  . Abdominal Pain   (Consider location/radiation/quality/duration/timing/severity/associated sxs/prior Treatment) Pt is an 60 yom who presents with abdominal pain and vomiting that began Monday night. Pt states that his wife fell in the yard and he tried to pick her up and get her back on her feet. Pt reports a history of abdominal surgeries and is not supposed to do heavy lifting. Pt reports decreased appetite and that the pain has been around his surgical site. Pt states the pain is around a "knot" he has on his abdomen that has been there for years. He states that he was told the "knot" should go away after his last surgery but that it has remained.  Patient states that he ate yesterday morning but has not eaten since. Patient also reports some gastric reflux but is not on any medications. He has been taking Tylenol for his pain and did have some diarrhea yesterday and is passing some gas.  Pt had a ventral hernia repair in 04/2014 by Dr. Pat Patrick for recurrent hernia.       Past Medical History:  Diagnosis Date  . History of lacunar cerebrovascular accident (CVA)   . History of prostate cancer   . History of subdural hematoma   . Hyperlipidemia   . Hypertension   . Hyperuricemia    History reviewed. No pertinent surgical history. History reviewed. No pertinent family history. Social History  Substance Use Topics  . Smoking status: Never Smoker  . Smokeless tobacco: Never Used  . Alcohol use No    Review of Systems  Constitutional: Positive for appetite change. Negative for fatigue and fever (decreased).  HENT: Negative.   Respiratory: Negative.  Negative for shortness of breath.   Cardiovascular: Negative.  Negative for chest pain.  Gastrointestinal: Positive for abdominal pain, diarrhea and vomiting. Negative for  rectal pain.       Ventral hernia with previous repair  Genitourinary: Negative.   Musculoskeletal: Negative.   Neurological: Negative.     Allergies  Patient has no known allergies.  Home Medications   Prior to Admission medications   Medication Sig Start Date End Date Taking? Authorizing Provider  acetaminophen (TYLENOL) 325 MG tablet Take 650 mg by mouth every 6 (six) hours as needed.   Yes [provider]  diazepam (VALIUM) 2 MG tablet Take 2 mg by mouth every 6 (six) hours as needed for anxiety.   Yes [provider]  dorzolamide-timolol (COSOPT) 22.3-6.8 MG/ML ophthalmic solution 1 drop 2 (two) times daily.   Yes [provider]  latanoprost (XALATAN) 0.005 % ophthalmic solution 1 drop at bedtime.   Yes [provider]  lisinopril (PRINIVIL,ZESTRIL) 10 MG tablet Take 10 mg by mouth daily.   Yes [provider]  senna (SENOKOT) 8.6 MG tablet Take 1 tablet by mouth daily.   Yes [provider]  simvastatin (ZOCOR) 20 MG tablet Take 20 mg by mouth daily.   Yes [provider]  brimonidine (ALPHAGAN) 0.2 % ophthalmic solution Place 1 drop into both eyes 2 (two) times daily. 07/25/16   [provider]  HYDROcodone-acetaminophen (NORCO) 5-325 MG tablet Take 0.5-1 tablets by mouth every 6 (six) hours as needed for moderate pain. Try one half tablet initially to see if helps with pain 08/11/16   Luvenia Redden, PA-C  ondansetron (ZOFRAN ODT) 8 MG disintegrating tablet Take 1 tablet (8 mg total) by mouth every 8 (eight) hours as needed for nausea or vomiting. 08/11/16   Luvenia Redden, PA-C  TRAVEL SICKNESS 25 MG CHEW TAKE ONE TABLET BY MOUTH THREE TIMES DAILY AS NEEDED DIZZINESS 07/15/16   [provider]   Meds Ordered and Administered this Visit  Medications - No data to display  BP (!) 147/80 (BP Location: Left Arm)   Pulse 84   Temp 98.2 F (36.8 C) (Oral)   Resp 18   Ht 6\' 2"  (1.88 m)   Wt 206 lb  (93.4 kg)   SpO2 97%   BMI 26.45 kg/m  No data found.   Physical Exam  Constitutional: He is oriented to person, place, and time. He appears well-nourished.  HENT:  Head: Normocephalic and atraumatic.  Eyes: EOM are normal. Pupils are equal, round, and reactive to light.  Neck: Neck supple.  Cardiovascular: Normal rate, regular rhythm and normal heart sounds.   Pulmonary/Chest: Effort normal and breath sounds normal.  Abdominal: Soft. Bowel sounds are normal. There is no rigidity and no guarding.    Midline surgical incision is well-healed.  Musculoskeletal: Normal range of motion.  Neurological: He is oriented to person, place, and time.    Urgent Care Course     Procedures  Labs Review Labs Reviewed - No data to display  Imaging Review No results found.   MDM   1. Epigastric pain   2. Recurrent ventral hernia   3. Vomiting without nausea, intractability of vomiting not specified, unspecified vomiting type    Discharge Medication List as of 08/11/2016 10:33 AM    START taking these medications   Details  HYDROcodone-acetaminophen (NORCO) 5-325 MG tablet Take 0.5-1 tablets by mouth every 6 (six) hours as needed for moderate pain. Try one half tablet initially to see if helps with pain, Starting Thu 08/11/2016, Print    ondansetron (ZOFRAN ODT) 8 MG disintegrating tablet Take 1 tablet (8 mg total) by mouth every 8 (eight) hours as needed for nausea or vomiting., Starting Thu 08/11/2016, Normal        Patient presents with abdominal pain that began Monday evening after helping his wife up off the ground after he fell. Patient also complaints of poor appetite and vomiting without nausea. Patient does have a reducible ventral hernia. Patient states that he has been there for a while and was told that it would improve after his last surgery but did not. Patient's last surgery was done by Dr. Pat Patrick who has since retired. Patient given a prescription for Norco 5/325 for pain  that is refractory to his home Tylenol. Patient advised to try a half tablet for the first dose and increase to a full tablet if needed. Patient also given a prescription for Zofran if needed. Patient has been referred to Dr. Burt Kn[place] with [place] surgery for a follow-up appointment next Wednesday. Patient advised to follow with Dr. Burt Knack since his pain is likely related to his previous injury after surgery. Patient was advised to present to the ER should his symptoms worsen or not improving next few days before he seen by Dr. Burt Knack. Patient verbalized understanding and is in agreement with the plan.  Luvenia Redden, PA-C     Luvenia Redden, PA-C 08/11/16 [phone]

## 2016-08-11 NOTE — Discharge Instructions (Signed)
-  abdominal pain likely related to previous hernia repair and lifting -Zofran 8 mg ODT every 8 hours as needed for nausea and vomiting. Placed under tongue and allowed to dissolve. -Norco 1/2 to 1 tablet every six hours for pain if pain not improved with Tylenol. Try 1/2 tablet the first time. -Follow up with surgeon next Wednesday in [place] with Dr. Burt Knack at 11:00 AM -if symptoms do not improve or worsen, then go to emergency room for evaluation.

## 2016-08-11 NOTE — ED Triage Notes (Signed)
Pt c/o abdominal pain, he is throwing. He mentions he was lifting something on Monday and he isnt suppose to lifting anything because he has had several stomach surgeries.

## 2016-08-17 ENCOUNTER — Encounter: Payer: Self-pay | Admitting: Surgery

## 2016-08-17 ENCOUNTER — Ambulatory Visit (INDEPENDENT_AMBULATORY_CARE_PROVIDER_SITE_OTHER): Payer: Medicare Other | Admitting: Surgery

## 2016-08-17 VITALS — BP 139/86 | HR 53 | Temp 98.1°F | Ht 74.0 in | Wt 207.2 lb

## 2016-08-17 DIAGNOSIS — K439 Ventral hernia without obstruction or gangrene: Secondary | ICD-10-CM

## 2016-08-17 NOTE — Patient Instructions (Signed)
We would like for you to wear an abdominal binder to see if this helps with your Hernia. You can purchase this at any Medical supply store or [employer] or any drug store.   Please call our office if you have any questions or concerns.

## 2016-08-17 NOTE — Progress Notes (Signed)
Surgical Consultation  08/17/2016  Alex Malone is an 83 y.o. male.   CC: Recurrent ventral hernia  HPI: This a patient with a history of multiple abdominal surgeries who is recently in the emergency room where a diagnosis of recurrent ventral hernia was obtained. I cannot find any imaging on the patient's visit. Patient had nausea vomiting and abdominal pain in the urgent care the other day. That has completely resolved now he is back to his normal status.  No family history of ventral hernias   Past Medical History:  Diagnosis Date  . History of lacunar cerebrovascular accident (CVA)   . History of prostate cancer   . History of subdural hematoma   . Hyperlipidemia   . Hyperlipidemia, unspecified 11/25/2012  . Hypertension   . Hyperuricemia     Past Surgical History:  Procedure Laterality Date  . CHOLECYSTECTOMY    . EVALUATION UNDER ANESTHESIA WITH HEMORRHOIDECTOMY    . PERINEAL PROSTATECTOMY    . subdermal hematoma      No family history on file.  Social History:  reports that he has never smoked. He has never used smokeless tobacco. He reports that he does not drink alcohol or use drugs.  Allergies: No Known Allergies  Medications reviewed.   Review of Systems:   Review of Systems  Constitutional: Negative.   HENT: Negative.   Eyes: Negative.   Respiratory: Negative.   Cardiovascular: Negative.   Gastrointestinal: Positive for abdominal pain, nausea and vomiting. Negative for blood in stool, constipation, diarrhea and heartburn.  Genitourinary: Negative.   Musculoskeletal: Negative.   Skin: Negative.   Neurological: Negative.   Endo/Heme/Allergies: Negative.   Psychiatric/Behavioral: Negative.      Physical Exam:  There were no vitals taken for this visit.  Physical Exam  Constitutional: He is oriented to person, place, and time and well-developed, well-nourished, and in no distress. No distress.  HENT:  Head: Normocephalic and atraumatic.   Eyes: Pupils are equal, round, and reactive to light. Right eye exhibits no discharge. Left eye exhibits no discharge. No scleral icterus.  Neck: Normal range of motion.  Cardiovascular: Normal rate, regular rhythm and normal heart sounds.   Pulmonary/Chest: Effort normal and breath sounds normal. No respiratory distress.  Abdominal: Soft. He exhibits no distension. There is no tenderness. There is no rebound.  Long midline scar Reducible nontender fairly wide mouth hernia to the left of midline cephalad.  Musculoskeletal: Normal range of motion. He exhibits no edema.  Lymphadenopathy:    He has no cervical adenopathy.  Neurological: He is alert and oriented to person, place, and time.  Skin: Skin is warm and dry. He is not diaphoretic. No erythema.  Psychiatric: Mood and affect normal.  Vitals reviewed.     No results found for this or any previous visit (from the past 48 hour(s)). No results found.  Assessment/Plan:  This patient with a fairly large hernia cephalad and it is a recurrence from prior repairs. It caused him nausea vomiting and abdominal pain the other day but that is completely resolved. I would like to obtain a CT scan has no prior studies were performed an urgent care. This would be used to delineate the extent of the hernia as well as to plan any staged operation. Patient informed me that he would not undergo any kind of surgery in the future under any circumstance. Therefore it is not necessary to obtain a CT scan for planning of any surgery. He did ask for an abdominal binder  which we will prescribe will follow up on an as-needed basis Florene Glen, MD, FACS

## 2017-02-14 ENCOUNTER — Ambulatory Visit
Admission: EM | Admit: 2017-02-14 | Discharge: 2017-02-14 | Disposition: A | Discharge status: Home / Self Care | Payer: Medicare Other | Attending: Family Medicine | Admitting: Family Medicine

## 2017-02-14 ENCOUNTER — Other Ambulatory Visit: Payer: Self-pay

## 2017-02-14 DIAGNOSIS — Z9049 Acquired absence of other specified parts of digestive tract: Secondary | ICD-10-CM | POA: Diagnosis not present

## 2017-02-14 DIAGNOSIS — R52 Pain, unspecified: Secondary | ICD-10-CM | POA: Diagnosis present

## 2017-02-14 DIAGNOSIS — K439 Ventral hernia without obstruction or gangrene: Secondary | ICD-10-CM

## 2017-02-14 DIAGNOSIS — Z79899 Other long term (current) drug therapy: Secondary | ICD-10-CM | POA: Diagnosis not present

## 2017-02-14 DIAGNOSIS — Z9889 Other specified postprocedural states: Secondary | ICD-10-CM | POA: Diagnosis not present

## 2017-02-14 LAB — CREATININE, SERUM
Creatinine, Ser: 1.1 mg/dL (ref 0.61–1.24)
GFR calc non Af Amer: >60 mL/min (ref >60)

## 2017-02-14 LAB — BUN: BUN: 16 mg/dL (ref 6–20)

## 2017-02-14 MED ORDER — TRAMADOL HCL 50 MG PO TABS: 50.0000 mg | ORAL_TABLET | Freq: Three times a day (TID) | ORAL | 0 refills | Status: DC | PRN

## 2017-02-14 NOTE — ED Provider Notes (Addendum)
[name]    CSN: [id number] Arrival date & time: 02/14/17  1022  History   Chief Complaint Chief Complaint  Patient presents with  . Hernia   HPI 83 year old male presents with the above complaint.  Patient reports that since Sunday, he has had worsening pain associated with his ventral hernia.  No associated nausea vomiting.  Pain is severe.  No fevers or chills.  No other associated symptoms.  No medications or interventions tried.  He has seen general surgery in the past and refused surgical intervention.  He states that he is reconsidering this.  He wants to go back and see general surgery.  He also wants something for the pain.  No other complaints or concerns at this time.  Past Medical History:  Diagnosis Date  . History of lacunar cerebrovascular accident (CVA)   . History of prostate cancer   . History of subdural hematoma   . Hyperlipidemia   . Hyperlipidemia, unspecified 11/25/2012  . Hypertension   . Hyperuricemia     Patient Active Problem List   Diagnosis Date Noted  . Hyperuricemia 08/11/2016  . History of lacunar cerebrovascular accident (CVA) 01/07/2014  . History of prostate cancer 01/07/2014  . History of subdural hematoma 01/07/2014  . Hyperlipidemia, unspecified 11/25/2012  . Hypertension 11/25/2012   Past Surgical History:  Procedure Laterality Date  . CHOLECYSTECTOMY    . EVALUATION UNDER ANESTHESIA WITH HEMORRHOIDECTOMY    . PERINEAL PROSTATECTOMY    . subdermal hematoma      Home Medications    Prior to Admission medications   Medication Sig Start Date End Date Taking? Authorizing Provider  acetaminophen (TYLENOL) 325 MG tablet Take 650 mg by mouth every 6 (six) hours as needed.   Yes [provider]  brimonidine (ALPHAGAN) 0.2 % ophthalmic solution Place 1 drop into both eyes 2 (two) times daily. 07/25/16  Yes [provider]  diazepam (VALIUM) 2 MG tablet Take 2 mg by mouth every 6 (six) hours as needed for  anxiety.   Yes [provider]  dorzolamide-timolol (COSOPT) 22.3-6.8 MG/ML ophthalmic solution 1 drop 2 (two) times daily.   Yes [provider]  latanoprost (XALATAN) 0.005 % ophthalmic solution 1 drop at bedtime.   Yes [provider]  lisinopril (PRINIVIL,ZESTRIL) 10 MG tablet Take 10 mg by mouth daily.   Yes [provider]  senna (SENOKOT) 8.6 MG tablet Take 1 tablet by mouth daily.   Yes [provider]  simvastatin (ZOCOR) 20 MG tablet Take 20 mg by mouth daily.   Yes [provider]  TRAVEL SICKNESS 25 MG CHEW TAKE ONE TABLET BY MOUTH THREE TIMES DAILY AS NEEDED DIZZINESS 07/15/16  Yes [provider]  traMADol (ULTRAM) 50 MG tablet Take 1 tablet (50 mg total) by mouth every 8 (eight) hours as needed. 02/14/17   Coral Spikes, DO    Family History Family History  Problem Relation Age of Onset  . Kidney disease Mother   . Cancer Father     Social History Social History   Tobacco Use  . Smoking status: Never Smoker  . Smokeless tobacco: Never Used  Substance Use Topics  . Alcohol use: No  . Drug use: No     Allergies   Patient has no known allergies.   Review of Systems Review of Systems  Constitutional: Negative.   Gastrointestinal: Positive for abdominal pain. Negative for nausea and vomiting.       Hernia.  Physical Exam Triage Vital Signs ED Triage Vitals  Enc Vitals Group     BP 02/14/17 1031 (!) 143/76     Pulse Rate 02/14/17 1031 74     Resp 02/14/17 1031 18     Temp 02/14/17 1031 97.6 F (36.4 C)     Temp Source 02/14/17 1031 Oral     SpO2 02/14/17 1031 99 %     Weight 02/14/17 1029 203 lb (92.1 kg)     Height 02/14/17 1029 6\' 2"  (1.88 m)     Head Circumference --      Peak Flow --      Pain Score 02/14/17 1029 9     Pain Loc --      Pain Edu? --      Excl. in [place]? --    No data found.  Updated Vital Signs BP (!) 143/76 (BP Location: Left Arm)   Pulse 74   Temp 97.6 F (36.4  C) (Oral)   Resp 18   Ht 6\' 2"  (1.88 m)   Wt 203 lb (92.1 kg)   SpO2 99%   BMI 26.06 kg/m   Physical Exam  Constitutional: He is oriented to person, place, and time. He appears well-developed. No distress.  Cardiovascular: Normal rate, regular rhythm and normal heart sounds.  Pulmonary/Chest: Effort normal and breath sounds normal. He has no wheezes. He has no rales.  Abdominal:  Soft, nondistended.  Patient with tenderness at the site of his ventral hernia.  Ventral hernias just to the left of midline above the umbilicus.  It is reducible.  Neurological: He is alert and oriented to person, place, and time.  Skin: Skin is warm. No rash noted.  Psychiatric: He has a normal mood and affect. His behavior is normal.  Vitals reviewed.  UC Treatments / Results  Labs (all labs ordered are listed, but only abnormal results are displayed) Labs Reviewed  CREATININE, SERUM  BUN    EKG  EKG Interpretation None       Radiology No results found.  Procedures Procedures (including critical care time)  Medications Ordered in UC Medications - No data to display   Initial Impression / Assessment and Plan / UC Course  I have reviewed the triage vital signs and the nursing notes.  Pertinent labs & imaging results that were available during my care of the patient were reviewed by me and considered in my medical decision making (see chart for details).     83 year old male presents with pain associated with his ventral hernia.  We arrange for the patient to return to see general surgery.  He has an upcoming appointment.  Surgeon wants CT scan.  CT scan ordered.  Tramadol for pain.  Final Clinical Impressions(s) / UC Diagnoses   Final diagnoses:  Ventral hernia without obstruction or gangrene    ED Discharge Orders        Ordered    traMADol (ULTRAM) 50 MG tablet  Every 8 hours PRN     02/14/17 1117    CT ABDOMEN PELVIS W CONTRAST     02/14/17 1118     Controlled Substance  Prescriptions [place] Controlled Substance Registry consulted? Not Applicable   Coral Spikes, DO 02/14/17 [place], Nenzel, DO 02/28/17 [phone]

## 2017-02-14 NOTE — ED Triage Notes (Signed)
Patient states that he is having issue with his hernia. Patient states that he was seen by Dr. Burt Knack (Gen Surg) in May and patient states that he wasn't ready to have surgery yet. Patient states that he thinks may need to go back to see them. Patient states that pain is all over abdomen.

## 2017-02-14 NOTE — Discharge Instructions (Signed)
Tramadol for pain.  See Dr. Burt Knack.   Take care  Dr. Lacinda Axon

## 2017-02-20 ENCOUNTER — Other Ambulatory Visit: Payer: Self-pay

## 2017-02-20 DIAGNOSIS — R42 Dizziness and giddiness: Secondary | ICD-10-CM | POA: Insufficient documentation

## 2017-02-20 DIAGNOSIS — F419 Anxiety disorder, unspecified: Secondary | ICD-10-CM | POA: Insufficient documentation

## 2017-02-21 ENCOUNTER — Ambulatory Visit
Admission: RE | Admit: 2017-02-21 | Discharge: 2017-02-21 | Disposition: A | Discharge status: Home / Self Care | Payer: Medicare Other | Source: Ambulatory Visit | Attending: Family Medicine | Admitting: Family Medicine

## 2017-02-21 DIAGNOSIS — I714 Abdominal aortic aneurysm, without rupture: Secondary | ICD-10-CM | POA: Insufficient documentation

## 2017-02-21 DIAGNOSIS — I723 Aneurysm of iliac artery: Secondary | ICD-10-CM | POA: Diagnosis not present

## 2017-02-21 DIAGNOSIS — I700 Atherosclerosis of aorta: Secondary | ICD-10-CM | POA: Insufficient documentation

## 2017-02-21 DIAGNOSIS — I2510 Atherosclerotic heart disease of native coronary artery without angina pectoris: Secondary | ICD-10-CM | POA: Diagnosis not present

## 2017-02-21 DIAGNOSIS — K439 Ventral hernia without obstruction or gangrene: Secondary | ICD-10-CM | POA: Insufficient documentation

## 2017-02-21 DIAGNOSIS — K449 Diaphragmatic hernia without obstruction or gangrene: Secondary | ICD-10-CM | POA: Diagnosis not present

## 2017-02-21 DIAGNOSIS — Z9079 Acquired absence of other genital organ(s): Secondary | ICD-10-CM | POA: Diagnosis not present

## 2017-02-21 DIAGNOSIS — K429 Umbilical hernia without obstruction or gangrene: Secondary | ICD-10-CM | POA: Insufficient documentation

## 2017-02-21 DIAGNOSIS — I708 Atherosclerosis of other arteries: Secondary | ICD-10-CM | POA: Insufficient documentation

## 2017-02-21 DIAGNOSIS — K5790 Diverticulosis of intestine, part unspecified, without perforation or abscess without bleeding: Secondary | ICD-10-CM | POA: Diagnosis not present

## 2017-02-21 MED ORDER — IOPAMIDOL (ISOVUE-300) INJECTION 61%
100.0000 mL | Freq: Once | INTRAVENOUS | Status: AC | PRN
  Administered 2017-02-21: 100 mL via INTRAVENOUS

## 2017-02-23 ENCOUNTER — Encounter: Payer: Self-pay | Admitting: Surgery

## 2017-02-23 ENCOUNTER — Telehealth: Payer: Self-pay | Admitting: Surgery

## 2017-02-23 ENCOUNTER — Ambulatory Visit (INDEPENDENT_AMBULATORY_CARE_PROVIDER_SITE_OTHER): Payer: Medicare Other | Admitting: Surgery

## 2017-02-23 VITALS — BP 136/84 | HR 71 | Temp 97.9°F | Wt 201.0 lb

## 2017-02-23 DIAGNOSIS — K439 Ventral hernia without obstruction or gangrene: Secondary | ICD-10-CM

## 2017-02-23 NOTE — Telephone Encounter (Signed)
Patient would like note to be released back to work. Please advise

## 2017-02-23 NOTE — Patient Instructions (Signed)
Please give [place] a call once you decide on proceeding with the surgery.

## 2017-02-23 NOTE — Telephone Encounter (Signed)
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 I asked if patient was taking naroctic medication and she verbalized that he is not taking any narcotics at this time. I did remind her that if he does take any narcotic medication that he will not be able to drive for 48 hours. She verbalized understanding and advised that she would be picking the letter up today.

## 2017-02-23 NOTE — Progress Notes (Signed)
Outpatient Surgical Follow Up  02/23/2017  Case Alex Malone is an 83 y.o. male.   CC:VH  HPI: This a patient with a recurrent ventral hernia.  He had been seen previously and he was mentioning that he would never have surgery because it was not bothering him that much and he would not have it under any circumstance to using his words.  Today he returns after being in the emergency room with pain.  His pain is completely gone.  A CT scan which I had suggested previously was performed in the ED showing a ventral hernia with bowel present.  At that time he had no nausea vomiting fevers or chills.  He was passing gas and having bowel movements.  A review of his medical respite records including operative report by Dr. Pat Patrick from 2016 shows that he had had prior mesh placed previous to Dr. Prince Rome robotic assisted repair of a ventral hernia with mesh.  Acquired extensive adhesio lysis.  Past Medical History:  Diagnosis Date  . History of lacunar cerebrovascular accident (CVA)   . History of prostate cancer   . History of subdural hematoma   . Hyperlipidemia   . Hyperlipidemia, unspecified 11/25/2012  . Hypertension   . Hyperuricemia     Past Surgical History:  Procedure Laterality Date  . CHOLECYSTECTOMY    . EVALUATION UNDER ANESTHESIA WITH HEMORRHOIDECTOMY    . PERINEAL PROSTATECTOMY    . subdermal hematoma      Family History  Problem Relation Age of Onset  . Kidney disease Mother   . Cancer Father     Social History:  reports that  has never smoked. he has never used smokeless tobacco. He reports that he does not drink alcohol or use drugs.  Allergies: No Known Allergies  Medications reviewed.   Review of Systems:   ROS   Physical Exam:  BP 136/84   Pulse 71   Temp 97.9 F (36.6 C) (Oral)   Wt 201 lb (91.2 kg)   BMI 25.81 kg/m   Physical Exam    No results found for this or any previous visit (from the past 48 hour(s)). Ct Abdomen Pelvis W Contrast  Result  Date: 02/21/2017 CLINICAL DATA:  84 year old male with mid abdominal pain for several weeks. Prior cholecystectomy and four hernia repairs. Prostate cancer. Post prostatectomy. Initial encounter. EXAM: CT ABDOMEN AND PELVIS WITH CONTRAST TECHNIQUE: Multidetector CT imaging of the abdomen and pelvis was performed using the standard protocol following bolus administration of intravenous contrast. CONTRAST:  174mL ISOVUE-300 IOPAMIDOL (ISOVUE-300) INJECTION 61% COMPARISON:  01/21/2013 CT. FINDINGS: Lower chest: No worrisome lung base abnormality. Heart size within normal limits. Coronary artery and aortic valve calcifications. Hepatobiliary: No worrisome hepatic lesion.  Post cholecystectomy. Pancreas: No pancreatic mass or inflammation. Spleen: No splenic mass or enlargement. Adrenals/Urinary Tract: Scarring right kidney. Parapelvic left renal cysts and left lower pole renal cyst. No obstructing stone or hydronephrosis. No adrenal lesion. Evaluation of urinary bladder limited by lack of contrast. Patient has had a prior prostatectomy. 1 cm nodular opacity right posterolateral bladder wall/ prostatectomy bed without significant change from 2014 and may represent scar from prostatectomy. Stomach/Bowel: Scattered diverticulosis. Findings most notable sigmoid colon where there is significant associated muscular hypertrophy but no evidence of extraluminal bowel inflammatory process. Left paracentral peri umbilical region hernia containing small bowel loops. This does not cause obstruction. Small hiatal hernia. Vascular/Lymphatic: Scattered small lymph nodes without adenopathy. Prominent complex plaque throughout the abdominal aorta which is ectatic. Distal abdominal  aorta aneurysm measures 3.1 x 2.8 cm versus 2.8 x 2.6 cm on 2014 exam. Left common iliac artery aneurysm with peripheral plaque measures 2.7 cm versus prior 2.4 cm. Plaque with high-grade stenosis origin celiac artery, superior mesenteric artery and inferior  mesenteric artery. Reproductive: As above. Other: Phleboliths superior left testicular region incidentally noted. Musculoskeletal: Postsurgical changes right ilium. Fusion sacroiliac joints. No sclerotic metastatic foci. Moderate L5-S1 disc degeneration.  Mild bulge L1-2. IMPRESSION: Scattered diverticulosis. Findings most notable sigmoid colon where there is significant associated muscular hypertrophy however, no evidence of extraluminal bowel inflammatory process. Left paracentral peri umbilical hernia containing small bowel loops. This does not cause obstruction. Small hiatal hernia. Prior prostatectomy. 1 cm nodular opacity right posterolateral bladder wall/ prostatectomy bed without significant chain from 2014 and may represent scar from prostatectomy. Aortic Atherosclerosis (ICD10-I70.0). Distal abdominal aortic aneurysm measures 3.1 x 2.8 cm versus 2.8 x 2.6 cm on 2014 exam. Recommend followup by ultrasound in 3 years. This recommendation follows ACR consensus guidelines: White Paper of the ACR Incidental Findings Committee II on Vascular Findings. J Am Coll Radiol 2013; 93:716-967 Left common iliac artery aneurysm measures 2.7 cm versus prior 2.4 cm. Plaque with high-grade stenosis origin celiac artery, superior mesenteric artery and inferior mesenteric artery. Coronary artery calcifications. Electronically Signed   By: Genia Del M.D.   On: 02/21/2017 17:58    Assessment/Plan:  CT scan is personally reviewed. Labs are personally reviewed Patient is still reluctant to schedule surgery.  After reviewing multiple operative reports from Dr. Pat Patrick and Dr. Rexene Edison and the findings of multiple pieces of mesh and extensive adhesions the risk of bowel injury as a day added to this risk benefit discussion.  The risk of bleeding infection mesh placement mesh infection bowel injury and recurrence were all reviewed with them.  They want to decide and discuss amongst themselves about surgery at some point.  The  patient remains very reluctant to schedule surgery.  Will call us with her decision.  Florene Glen, MD, FACS

## 2017-03-30 ENCOUNTER — Telehealth: Payer: Self-pay | Admitting: Surgery

## 2017-03-30 NOTE — Telephone Encounter (Signed)
Spoke with patient at this time. He states he had some nausea and  vomiting earlier in the week, however has not had any today.  He states his pain is no worse that what it has been.  He is added to Dr.Piscoya 04/06/17 @ 10:45 am to be seen. Patient states he does not feel he needs to be seen sooner.  Denies fever, chills or increased pain.

## 2017-03-30 NOTE — Telephone Encounter (Signed)
Patient having some nausea and pain. Scheduled for 1/23 with Burt Knack to discuss surgery scheduling. Would like to know if there is something he can take for nausea until he sees [place]. Please advise

## 2017-04-04 ENCOUNTER — Ambulatory Visit: Payer: Medicare Other | Admitting: Surgery

## 2017-04-06 ENCOUNTER — Ambulatory Visit: Payer: Medicare Other | Admitting: Surgery

## 2017-04-19 ENCOUNTER — Ambulatory Visit: Payer: Medicare Other | Admitting: Surgery

## 2017-06-27 ENCOUNTER — Encounter: Payer: Self-pay | Admitting: Medical Oncology

## 2017-06-27 ENCOUNTER — Emergency Department
Admission: EM | Admit: 2017-06-27 | Discharge: 2017-06-27 | Disposition: A | Discharge status: Home / Self Care | Payer: Medicare Other | Attending: Emergency Medicine | Admitting: Emergency Medicine

## 2017-06-27 ENCOUNTER — Emergency Department: Payer: Medicare Other

## 2017-06-27 DIAGNOSIS — K469 Unspecified abdominal hernia without obstruction or gangrene: Secondary | ICD-10-CM | POA: Insufficient documentation

## 2017-06-27 DIAGNOSIS — I10 Essential (primary) hypertension: Secondary | ICD-10-CM | POA: Insufficient documentation

## 2017-06-27 DIAGNOSIS — Z8546 Personal history of malignant neoplasm of prostate: Secondary | ICD-10-CM | POA: Insufficient documentation

## 2017-06-27 DIAGNOSIS — K439 Ventral hernia without obstruction or gangrene: Secondary | ICD-10-CM

## 2017-06-27 DIAGNOSIS — K56699 Other intestinal obstruction unspecified as to partial versus complete obstruction: Secondary | ICD-10-CM | POA: Diagnosis not present

## 2017-06-27 DIAGNOSIS — K56609 Unspecified intestinal obstruction, unspecified as to partial versus complete obstruction: Secondary | ICD-10-CM | POA: Diagnosis not present

## 2017-06-27 DIAGNOSIS — F419 Anxiety disorder, unspecified: Secondary | ICD-10-CM | POA: Insufficient documentation

## 2017-06-27 DIAGNOSIS — Z8673 Personal history of transient ischemic attack (TIA), and cerebral infarction without residual deficits: Secondary | ICD-10-CM | POA: Diagnosis not present

## 2017-06-27 DIAGNOSIS — K432 Incisional hernia without obstruction or gangrene: Secondary | ICD-10-CM | POA: Insufficient documentation

## 2017-06-27 DIAGNOSIS — R112 Nausea with vomiting, unspecified: Secondary | ICD-10-CM | POA: Diagnosis present

## 2017-06-27 DIAGNOSIS — Z9049 Acquired absence of other specified parts of digestive tract: Secondary | ICD-10-CM | POA: Diagnosis not present

## 2017-06-27 LAB — COMPREHENSIVE METABOLIC PANEL
ALBUMIN: 4 g/dL (ref 3.5–5.0)
ALK PHOS: 47 U/L (ref 38–126)
ALT: 36 U/L (ref 17–63)
AST: 43 U/L — AB (ref 15–41)
Anion gap: 8 (ref 5–15)
BUN: 18 mg/dL (ref 6–20)
CALCIUM: 9.7 mg/dL (ref 8.9–10.3)
CHLORIDE: 106 mmol/L (ref 101–111)
CO2: 26 mmol/L (ref 22–32)
CREATININE: 1.13 mg/dL (ref 0.61–1.24)
GFR calc non Af Amer: 59 mL/min — ABNORMAL LOW (ref >60)
GLUCOSE: 135 mg/dL — AB (ref 65–99)
Potassium: 4.3 mmol/L (ref 3.5–5.1)
SODIUM: 140 mmol/L (ref 135–145)
Total Bilirubin: 0.7 mg/dL (ref 0.3–1.2)
Total Protein: 7.9 g/dL (ref 6.5–8.1)

## 2017-06-27 LAB — URINALYSIS, COMPLETE (UACMP) WITH MICROSCOPIC
Bacteria, UA: NONE SEEN
Glucose, UA: NEGATIVE mg/dL
Hgb urine dipstick: NEGATIVE
Ketones, ur: 5 mg/dL — AB
Leukocytes, UA: NEGATIVE
Nitrite: NEGATIVE
Protein, ur: 30 mg/dL — AB
Specific Gravity, Urine: 1.034 — ABNORMAL HIGH (ref 1.005–1.030)
pH: 5 (ref 5.0–8.0)

## 2017-06-27 LAB — CBC
HCT: 50.5 % (ref 40.0–52.0)
Hemoglobin: 16.8 g/dL (ref 13.0–18.0)
MCH: 29.6 pg (ref 26.0–34.0)
MCHC: 33.2 g/dL (ref 32.0–36.0)
MCV: 89.2 fL (ref 80.0–100.0)
PLATELETS: 218 10*3/uL (ref 150–440)
RBC: 5.67 MIL/uL (ref 4.40–5.90)
RDW: 13.6 % (ref 11.5–14.5)
WBC: 9 10*3/uL (ref 3.8–10.6)

## 2017-06-27 LAB — LIPASE, BLOOD: Lipase: 29 U/L (ref 11–51)

## 2017-06-27 MED ORDER — ONDANSETRON HCL 4 MG/2ML IJ SOLN
4.0000 mg | Freq: Once | INTRAMUSCULAR | Status: AC
  Administered 2017-06-27: 4 mg via INTRAVENOUS
  Filled 2017-06-27: qty 2

## 2017-06-27 MED ORDER — ONDANSETRON 4 MG PO TBDP: 4.0000 mg | ORAL_TABLET | Freq: Three times a day (TID) | ORAL | 0 refills | Status: DC | PRN

## 2017-06-27 MED ORDER — SODIUM CHLORIDE 0.9 % IV BOLUS
1000.0000 mL | Freq: Once | INTRAVENOUS | Status: AC
  Administered 2017-06-27: 1000 mL via INTRAVENOUS

## 2017-06-27 MED ORDER — IOPAMIDOL (ISOVUE-300) INJECTION 61%
100.0000 mL | Freq: Once | INTRAVENOUS | Status: AC | PRN
  Administered 2017-06-27: 100 mL via INTRAVENOUS
  Filled 2017-06-27: qty 100

## 2017-06-27 MED ORDER — FENTANYL CITRATE (PF) 100 MCG/2ML IJ SOLN
50.0000 ug | Freq: Once | INTRAMUSCULAR | Status: AC
  Administered 2017-06-27: 50 ug via INTRAVENOUS
  Filled 2017-06-27: qty 2

## 2017-06-27 NOTE — ED Notes (Signed)
Patient transported to CT 

## 2017-06-27 NOTE — Discharge Instructions (Signed)
Please take a clear liquid diet for the next 24 hours, then advance to a bland diet as tolerated.  You may take Zofran for nausea or vomiting.  Return to the emergency department if you develop severe pain, inability to keep down fluids, fever, or any other symptoms concerning to you.

## 2017-06-27 NOTE — ED Provider Notes (Addendum)
[facility] Emergency Department Provider Note  ____________________________________________  Time seen: Approximately 2:30 PM  I have reviewed the triage vital signs and the nursing notes.   HISTORY  Chief Complaint Hernia and Emesis    HPI Alex Malone is a 84 y.o. male the history of, HTN, HL, and prior abdominal wall hernia repair x2 presenting with painful hernia, nausea and vomiting.  The patient reports that for the past 2 weeks, he has had an almost constant pain at the site of his hernia and it is no longer reducible.  He has had intermittent nausea and vomiting until last night when he began to have inability to tolerate anything by mouth without vomiting.  He has not tried anything for his pain.  He has been evaluated twice in the recent past by Dr. Burt Knack for ongoing symptoms from his hernia.  No fevers or chills.  No penile or testicular pain.  Past Medical History:  Diagnosis Date  . History of lacunar cerebrovascular accident (CVA)   . History of prostate cancer   . History of subdural hematoma   . Hyperlipidemia   . Hyperlipidemia, unspecified 11/25/2012  . Hypertension   . Hyperuricemia     Patient Active Problem List   Diagnosis Date Noted  . Anxiety 02/20/2017  . Vertigo 02/20/2017  . Hyperuricemia 08/11/2016  . History of lacunar cerebrovascular accident (CVA) 01/07/2014  . History of prostate cancer 01/07/2014  . History of subdural hematoma 01/07/2014  . Hyperlipidemia, unspecified 11/25/2012  . Hypertension 11/25/2012    Past Surgical History:  Procedure Laterality Date  . CHOLECYSTECTOMY    . EVALUATION UNDER ANESTHESIA WITH HEMORRHOIDECTOMY    . PERINEAL PROSTATECTOMY    . subdermal hematoma      Current Outpatient Rx  . Order #: [id number] Class: Historical Med  . Order #: [id number] Class: Historical Med  . Order #: [id number] Class: Historical Med  . Order #: [id number] Class: Historical Med  . Order #: [id number] Class:  Historical Med  . Order #: [id number] Class: Historical Med  . Order #: [id number] Class: Historical Med  . Order #: [id number] Class: Historical Med  . Order #: [id number] Class: Historical Med  . Order #: [id number] Class: Print    Allergies Patient has no known allergies.  Family History  Problem Relation Age of Onset  . Kidney disease Mother   . Cancer Father     Social History Social History   Tobacco Use  . Smoking status: Never Smoker  . Smokeless tobacco: Never Used  Substance Use Topics  . Alcohol use: No  . Drug use: No    Review of Systems Constitutional: No fever/chills.  No lightheadedness or syncope. Eyes: No visual changes. ENT: No sore throat. No congestion or rhinorrhea. Cardiovascular: Denies chest pain. Denies palpitations. Respiratory: Denies shortness of breath.  No cough. Gastrointestinal: Active abdominal pain at hernia site; hernia no longer reducible..  +nausea, +vomiting.  No diarrhea.  No constipation. Genitourinary: Negative for dysuria.  Penile or testicular pain. Musculoskeletal: Negative for back pain. Skin: Negative for rash. Neurological: Negative for headaches. No focal numbness, tingling or weakness.     ____________________________________________   PHYSICAL EXAM:  VITAL SIGNS: ED Triage Vitals [06/27/17 1137]  Enc Vitals Group     BP (!) 150/96     Pulse Rate 93     Resp 18     Temp 97.8 F (36.6 C)     Temp Source Oral     SpO2 94 %     Weight 202 lb (91.6  kg)     Height 6\' 2"  (1.88 m)     Head Circumference      Peak Flow      Pain Score 9     Pain Loc      Pain Edu?      Excl. in [place]?     Constitutional: Alert and oriented. Well appearing and in no acute distress. Answers questions appropriately.  Mildly slurred speech which is chronic, likely from his prior CVA. Eyes: Conjunctivae are normal.  EOMI. No scleral icterus. Head: Atraumatic. Nose: No congestion/rhinnorhea. Mouth/Throat: Mucous membranes are mildly dry.   Neck: No stridor.  Supple.  No JVD.  No meningismus. Cardiovascular: Normal rate, regular rhythm. No murmurs, rubs or gallops.  Respiratory: Normal respiratory effort.  No accessory muscle use or retractions. Lungs CTAB.  No wheezes, rales or ronchi. Gastrointestinal: Overweight.  Soft, and mildly distended.  The patient has a 3 x 3 inch hernia superior lateral to the umbilicus on the left side which is tender to palpation and only partially reducible on my examination.  No guarding or rebound.  No peritoneal signs. Musculoskeletal: No LE edema.  Neurologic:  A&Ox3.  Speech is slightly slurred.  Face and smile are symmetric.  EOMI.  Moves all extremities well. Skin:  Skin is warm, dry and intact. No rash noted. Psychiatric: Mood and affect are normal. Speech and behavior are normal.  Normal judgement.  ____________________________________________   LABS (all labs ordered are listed, but only abnormal results are displayed)  Labs Reviewed  COMPREHENSIVE METABOLIC PANEL - Abnormal; Notable for the following components:      Result Value   Glucose, Bld 135 (*)    AST 43 (*)    GFR calc non Af Amer 59 (*)    All other components within normal limits  URINALYSIS, COMPLETE (UACMP) WITH MICROSCOPIC - Abnormal; Notable for the following components:   Color, Urine AMBER (*)    APPearance CLEAR (*)    Specific Gravity, Urine 1.034 (*)    Bilirubin Urine SMALL (*)    Ketones, ur 5 (*)    Protein, ur 30 (*)    Squamous Epithelial / LPF 0-5 (*)    All other components within normal limits  LIPASE, BLOOD  CBC   ____________________________________________  EKG  Not indicated ____________________________________________  RADIOLOGY  Ct Abdomen Pelvis W Contrast  Result Date: 06/27/2017 CLINICAL DATA:  Painful anterior abdominal wall hernia with nausea and vomiting. EXAM: CT ABDOMEN AND PELVIS WITH CONTRAST TECHNIQUE: Multidetector CT imaging of the abdomen and pelvis was performed using  the standard protocol following bolus administration of intravenous contrast. CONTRAST:  182mL ISOVUE-300 IOPAMIDOL (ISOVUE-300) INJECTION 61% COMPARISON:  CT scan 02/21/2017 FINDINGS: Lower chest: The lung bases are clear of an acute process. Stable peripheral interstitial lung disease. No worrisome pulmonary lesions. The heart is normal in size. No pericardial effusion. Dense three-vessel coronary artery calcifications are noted. There is a small hiatal hernia. Hepatobiliary: No focal hepatic lesions or intrahepatic biliary dilatation. The gallbladder is surgically absent. Stable mild associated biliary dilatation. Pancreas: No mass, inflammation or ductal dilatation. Moderate pancreatic atrophy. Spleen: Normal size.  No focal lesions. Adrenals/Urinary Tract: The adrenal glands and kidneys are unremarkable. Stable renal cysts and renal scarring changes but no worrisome lesions or hydronephrosis. Stomach/Bowel: The stomach is unremarkable. The duodenum is normal. Mid small bowel loops are dilated with air-fluid levels consistent with obstruction. This is due to a left-sided abdominal wall hernia. The bowel is dilated down to  the hernia and decompressed post hernia. No bowel ischemia or pneumatosis. The terminal ileum and appendix are normal. Diffuse colonic diverticulosis without findings for acute diverticulitis. Colon is relatively decompressed. Vascular/Lymphatic: Advanced atherosclerotic calcifications involving the aorta and branch vessels. Stable left iliac artery aneurysm measuring 23 mm. No adenopathy. Reproductive: Status post prostatectomy. Other: No pelvic mass or adenopathy. No free pelvic fluid collections. No inguinal mass or adenopathy. No abdominal wall hernia or subcutaneous lesions. Musculoskeletal: No significant bony findings. Surgical changes involving the right iliac bone. IMPRESSION: 1. Small-bowel obstruction due to an anterior abdominal wall hernia. 2. Diffuse colonic diverticulosis without  findings for acute diverticulitis. 3. Status post cholecystectomy.  No biliary dilatation. 4. Small hiatal hernia. 5. Severe atherosclerotic changes involving the aorta and branch vessels. 6. Status post prostatectomy. Electronically Signed   By: Marijo Sanes M.D.   On: 06/27/2017 15:36    ____________________________________________   PROCEDURES  Procedure(s) performed: None  Procedures  Critical Care performed: No ____________________________________________   INITIAL IMPRESSION / ASSESSMENT AND PLAN / ED COURSE  Pertinent labs & imaging results that were available during my care of the patient were reviewed by me and considered in my medical decision making (see chart for details).  84 y.o. male with a history of known abdominal wall hernia presenting with 2 weeks of progressively worsening pain around his hernia site, now with nausea and vomiting since last night.  Overall, the patient is afebrile.  He does have a hernia which I am only partially able to reduce.  I am concerned about incarcerated hernia.  Other etiologies include viral GI illness, and much less likely diverticulitis.  Appendicitis is very unlikely.  Patient does have a history of prostate cancer, so malignancy is also possible.  Plan CT scan, symptomatic treatment, and reevaluation for final disposition.  At this time, the patient's laboratory studies are reassuring with normal blood counts and normal white blood cell count.  No evidence of UTI, normal electrolytes as well as hepatic function panel.  ----------------------------------------- 3:49 PM on 06/27/2017 -----------------------------------------  The patient's CT examination does show an anterior wall hernia which is causing a small bowel obstruction.  At this time, the patient is not vomiting so we will hold on NG tube placement.  The surgeon, Dr. Hampton Abbot, has been notified for admission.  ----------------------------------------- 5:44 PM on  06/27/2017 -----------------------------------------  The patient has been seen by Dr. Hampton Abbot, who has reduce the patient's hernia.  The patient is feeling significantly better and able to tolerate fluids.  After discussion with the surgeon, it is felt that the patient is safe to go home with conservative management and outpatient follow-up with Dr. Burt Knack at the end of this week.  I have given the patient's specific return precautions as well.  Plan discharge at this time.  ____________________________________________  FINAL CLINICAL IMPRESSION(S) / ED DIAGNOSES  Final diagnoses:  Hernia of anterior abdominal wall  Small bowel obstruction ([place])         NEW MEDICATIONS STARTED DURING THIS VISIT:  New Prescriptions   No medications on file      Eula Listen, MD 06/27/17 1549    Eula Listen, MD 06/27/17 1745

## 2017-06-27 NOTE — ED Notes (Signed)
Pt c/o intermittent abd hernia pain for the past week,, states it the past 24hrs the pain has worsened with tenderness to the hernia site. Pt has a hx of hernia repair to the same area. EDP was able to partially reduce the hernia.

## 2017-06-27 NOTE — Consult Note (Signed)
Date of Consultation:  06/27/2017  Requesting Physician:  Eula Listen  Reason for Consultation:  Small bowel obstruction  History of Present Illness: Alex Malone is a 84 y.o. male who presents with intermittent episodes of nausea and vomiting.  He has a known incisional hernia to the left of midline from prior abdominal surgeries.  He has been followed by Dr. Burt Knack in the past and the patient had not wanted surgery.  He reports that recently over the past two weeks, he feels his hernia has been bulging out more and has had intermittent nausea and vomiting.  This does not happen all the time and he is able to eat and drink liquids.  He has been having flatus and bowel movements and had a bowel movement yesterday and flatus today.  Reports some pain associated with the hernia.  Does not seem that he's been trying to push it back in.  Overnight his nausea and emesis worsened, though this has improved this afternoon.  Nevertheless he presented to the ED for further evaluation.  In the ED, labs were overall unremarkable, with normal creatinine of 1.13 (baseline closer to 1.0), and normal WBC of 9.0.  He had a CT scan which did show a ventral hernia with a loop of small bowel inside, with decompressed bowel distal and mildly dilated bowel proximal.  This is the same location of his known hernia.  Past Medical History: Past Medical History:  Diagnosis Date  . History of lacunar cerebrovascular accident (CVA)   . History of prostate cancer   . History of subdural hematoma   . Hyperlipidemia   . Hyperlipidemia, unspecified 11/25/2012  . Hypertension   . Hyperuricemia      Past Surgical History: Past Surgical History:  Procedure Laterality Date  . CHOLECYSTECTOMY    . EVALUATION UNDER ANESTHESIA WITH HEMORRHOIDECTOMY    . PERINEAL PROSTATECTOMY    . subdermal hematoma      Home Medications: Prior to Admission medications   Medication Sig Start Date End Date Taking? Authorizing  Provider  brimonidine (ALPHAGAN) 0.2 % ophthalmic solution Place 1 drop into both eyes 2 (two) times daily. 07/25/16  Yes [provider]  dorzolamide-timolol (COSOPT) 22.3-6.8 MG/ML ophthalmic solution 1 drop 2 (two) times daily.   Yes [provider]  latanoprost (XALATAN) 0.005 % ophthalmic solution 1 drop at bedtime.   Yes [provider]  lisinopril (PRINIVIL,ZESTRIL) 10 MG tablet Take 10 mg by mouth daily.   Yes [provider]  simvastatin (ZOCOR) 20 MG tablet Take 20 mg by mouth daily.   Yes [provider]  acetaminophen (TYLENOL) 325 MG tablet Take 650 mg by mouth every 6 (six) hours as needed.    [provider]  diazepam (VALIUM) 2 MG tablet Take 2 mg by mouth every 6 (six) hours as needed for anxiety.    [provider]  Meclizine HCl 25 MG CHEW Chew 1 tablet by mouth 1 day or 1 dose. 01/31/17   [provider]  ondansetron (ZOFRAN ODT) 4 MG disintegrating tablet Take 1 tablet (4 mg total) by mouth every 8 (eight) hours as needed for nausea or vomiting. 06/27/17   Eula Listen, MD  senna (SENOKOT) 8.6 MG tablet Take 1 tablet by mouth daily.    [provider]  traMADol (ULTRAM) 50 MG tablet Take 1 tablet (50 mg total) by mouth every 8 (eight) hours as needed. Patient not taking: Reported on 06/27/2017 02/14/17   Coral Spikes, DO  Allergies: No Known Allergies  Social History:  reports that he has never smoked. He has never used smokeless tobacco. He reports that he does not drink alcohol or use drugs.   Family History: Family History  Problem Relation Age of Onset  . Kidney disease Mother   . Cancer Father     Review of Systems: Review of Systems  Constitutional: Negative for chills and fever.  HENT: Negative for hearing loss.   Eyes: Negative for blurred vision.  Respiratory: Negative for shortness of breath.   Cardiovascular: Negative for chest pain.  Gastrointestinal: Positive for  abdominal pain, nausea and vomiting. Negative for constipation and diarrhea.  Genitourinary: Negative for dysuria.  Musculoskeletal: Negative for myalgias.  Skin: Negative for rash.  Neurological: Negative for dizziness.  Psychiatric/Behavioral: Negative for depression.  All other systems reviewed and are negative.   Physical Exam BP 127/76 (BP Location: Right Arm)   Pulse 74   Temp 97.8 F (36.6 C) (Oral)   Resp 16   Ht 6\' 2"  (1.88 m)   Wt 202 lb (91.6 kg)   SpO2 97%   BMI 25.94 kg/m  CONSTITUTIONAL: No acute distress HEENT:  Normocephalic, atraumatic, extraocular motion intact. NECK: Trachea is midline, and there is no jugular venous distension.  RESPIRATORY:  Lungs are clear, and breath sounds are equal bilaterally. Normal respiratory effort without pathologic use of accessory muscles. CARDIOVASCULAR: Heart is regular without murmurs, gallops, or rubs. GI: The abdomen is soft, non-distended, with no tenderness to palpation.  Patient's ventral hernia is very easily reducible without any significant tenderness while doing so. Hernia defect measures about 5 cm in diameter. MUSCULOSKELETAL:  Normal muscle strength and tone in all four extremities.  No peripheral edema or cyanosis. SKIN: Skin turgor is normal. There are no pathologic skin lesions.  NEUROLOGIC:  Motor and sensation is grossly normal.  Cranial nerves are grossly intact. PSYCH:  Alert and oriented to person, place and time. Affect is normal.  Laboratory Analysis: Results for orders placed or performed during the hospital encounter of 06/27/17 (from the past 24 hour(s))  Lipase, blood     Status: None   Collection Time: 06/27/17 11:42 AM  Result Value Ref Range   Lipase 29 11 - 51 U/L  Comprehensive metabolic panel     Status: Abnormal   Collection Time: 06/27/17 11:42 AM  Result Value Ref Range   Sodium 140 135 - 145 mmol/L   Potassium 4.3 3.5 - 5.1 mmol/L   Chloride 106 101 - 111 mmol/L   CO2 26 22 - 32 mmol/L    Glucose, Bld 135 (H) 65 - 99 mg/dL   BUN 18 6 - 20 mg/dL   Creatinine, Ser 1.13 0.61 - 1.24 mg/dL   Calcium 9.7 8.9 - 10.3 mg/dL   Total Protein 7.9 6.5 - 8.1 g/dL   Albumin 4.0 3.5 - 5.0 g/dL   AST 43 (H) 15 - 41 U/L   ALT 36 17 - 63 U/L   Alkaline Phosphatase 47 38 - 126 U/L   Total Bilirubin 0.7 0.3 - 1.2 mg/dL   GFR calc non Af Amer 59 (L) >60 mL/min   GFR calc Af Amer >60 >60 mL/min   Anion gap 8 5 - 15  CBC     Status: None   Collection Time: 06/27/17 11:42 AM  Result Value Ref Range   WBC 9.0 3.8 - 10.6 K/uL   RBC 5.67 4.40 - 5.90 MIL/uL   Hemoglobin 16.8 13.0 - 18.0 g/dL  HCT 50.5 40.0 - 52.0 %   MCV 89.2 80.0 - 100.0 fL   MCH 29.6 26.0 - 34.0 pg   MCHC 33.2 32.0 - 36.0 g/dL   RDW 13.6 11.5 - 14.5 %   Platelets 218 150 - 440 K/uL  Urinalysis, Complete w Microscopic     Status: Abnormal   Collection Time: 06/27/17 11:42 AM  Result Value Ref Range   Color, Urine AMBER (A) YELLOW   APPearance CLEAR (A) CLEAR   Specific Gravity, Urine 1.034 (H) 1.005 - 1.030   pH 5.0 5.0 - 8.0   Glucose, UA NEGATIVE NEGATIVE mg/dL   Hgb urine dipstick NEGATIVE NEGATIVE   Bilirubin Urine SMALL (A) NEGATIVE   Ketones, ur 5 (A) NEGATIVE mg/dL   Protein, ur 30 (A) NEGATIVE mg/dL   Nitrite NEGATIVE NEGATIVE   Leukocytes, UA NEGATIVE NEGATIVE   RBC / HPF 0-5 0 - 5 RBC/hpf   WBC, UA 0-5 0 - 5 WBC/hpf   Bacteria, UA NONE SEEN NONE SEEN   Squamous Epithelial / LPF 0-5 (A) NONE SEEN   Mucus PRESENT     Imaging: Ct Abdomen Pelvis W Contrast  Result Date: 06/27/2017 CLINICAL DATA:  Painful anterior abdominal wall hernia with nausea and vomiting. EXAM: CT ABDOMEN AND PELVIS WITH CONTRAST TECHNIQUE: Multidetector CT imaging of the abdomen and pelvis was performed using the standard protocol following bolus administration of intravenous contrast. CONTRAST:  16mL ISOVUE-300 IOPAMIDOL (ISOVUE-300) INJECTION 61% COMPARISON:  CT scan 02/21/2017 FINDINGS: Lower chest: The lung bases are clear of  an acute process. Stable peripheral interstitial lung disease. No worrisome pulmonary lesions. The heart is normal in size. No pericardial effusion. Dense three-vessel coronary artery calcifications are noted. There is a small hiatal hernia. Hepatobiliary: No focal hepatic lesions or intrahepatic biliary dilatation. The gallbladder is surgically absent. Stable mild associated biliary dilatation. Pancreas: No mass, inflammation or ductal dilatation. Moderate pancreatic atrophy. Spleen: Normal size.  No focal lesions. Adrenals/Urinary Tract: The adrenal glands and kidneys are unremarkable. Stable renal cysts and renal scarring changes but no worrisome lesions or hydronephrosis. Stomach/Bowel: The stomach is unremarkable. The duodenum is normal. Mid small bowel loops are dilated with air-fluid levels consistent with obstruction. This is due to a left-sided abdominal wall hernia. The bowel is dilated down to the hernia and decompressed post hernia. No bowel ischemia or pneumatosis. The terminal ileum and appendix are normal. Diffuse colonic diverticulosis without findings for acute diverticulitis. Colon is relatively decompressed. Vascular/Lymphatic: Advanced atherosclerotic calcifications involving the aorta and branch vessels. Stable left iliac artery aneurysm measuring 23 mm. No adenopathy. Reproductive: Status post prostatectomy. Other: No pelvic mass or adenopathy. No free pelvic fluid collections. No inguinal mass or adenopathy. No abdominal wall hernia or subcutaneous lesions. Musculoskeletal: No significant bony findings. Surgical changes involving the right iliac bone. IMPRESSION: 1. Small-bowel obstruction due to an anterior abdominal wall hernia. 2. Diffuse colonic diverticulosis without findings for acute diverticulitis. 3. Status post cholecystectomy.  No biliary dilatation. 4. Small hiatal hernia. 5. Severe atherosclerotic changes involving the aorta and branch vessels. 6. Status post prostatectomy.  Electronically Signed   By: Marijo Sanes M.D.   On: 06/27/2017 15:36    Assessment and Plan: This is a 84 y.o. male who presents with a known incisional ventral hernia with small bowel obstruction on CT scan.  I have independently viewed the patient's imaging study and reviewed his laboratory studies.  The patient's incisional hernia is very easily reducible.  He is not having any tenderness  on my exam.  He has received pain medication while in the ED, but his exam is overall very reassuring despite the pain medication.  He feels better and denies any further nausea.  Instructed the patient on how to reduce the hernia as well for future reference.  Overall discussed with the patient the option of bringing him into the hospital for observation and diet advancement, vs doing this at home and he has elected to go home.  He was able to tolerate clears in the ED.  Instructed the patient do to clear liquids tonight at home, and tomorrow do full liquids for breakfast and then regular diet for lunch.  If he does well, he should follow up with Dr. Burt Knack this week for further discussion about his hernia.  He does seem more amenable to surgery, but the patient has been very hesitant about it in the past.  If he worsens, he knows to come back to the hospital.  Patient understands this plan and all of his questions have been answered.   Melvyn Neth, [place]

## 2017-06-27 NOTE — ED Triage Notes (Signed)
Pt reports his hernia has been painful since last week. Last night pt began having nausea and vomiting. Denies fever, denies diarrhea.

## 2017-06-30 ENCOUNTER — Encounter: Payer: Self-pay | Admitting: Surgery

## 2017-06-30 ENCOUNTER — Ambulatory Visit (INDEPENDENT_AMBULATORY_CARE_PROVIDER_SITE_OTHER): Payer: Medicare Other | Admitting: Surgery

## 2017-06-30 VITALS — BP 150/81 | HR 71 | Temp 98.7°F | Resp 18 | Ht 74.0 in | Wt 211.0 lb

## 2017-06-30 DIAGNOSIS — K439 Ventral hernia without obstruction or gangrene: Secondary | ICD-10-CM

## 2017-06-30 NOTE — Patient Instructions (Signed)
Laparoscopic Ventral Hernia Repair Laparoscopic ventral hernia repairis a procedure to fix a bulge of tissue that pushes through a weak area of muscle in the abdomen (ventral hernia). A ventral hernia may be at the belly button (umbilical), above the belly button (epigastric), or at the incision site from previous abdominal surgery (incisional hernia). You may have this procedure as emergency surgery if part of your intestine gets trapped inside the hernia and starts to lose its blood supply (strangulation). Laparoscopic surgery is done through small incisions using a thin surgical telescope with a light and camera on the end (laparoscope). During surgery, your surgeon will use images from the laparoscope to guide the procedure. A mesh screen will be placed in the hernia to close the opening and strengthen the abdominal wall. Tell a health care provider about:  Any allergies you have.  All medicines you are taking, including vitamins, herbs, eye drops, creams, and over-the-counter medicines.  Any problems you or family members have had with anesthetic medicines.  Any blood disorders you have.  Any surgeries you have had.  Any medical conditions you have.  Whether you are pregnant or may be pregnant. What are the risks? Generally, this is a safe procedure. However, problems may occur, including:  Infection.  Bleeding.  Allergic reactions to medicines.  Damage to other structures or organs in the abdomen.  Trouble urinating or having a bowel movement after surgery.  Pneumonia.  Blood clots.  The hernia coming back after surgery.  Fluid buildup in the area of the hernia.  In some cases, your health care provider may need to switch from a laparoscopic procedure to a procedure that is done through a single, larger incision in the abdomen (open procedure). You may need an open procedure if:  You have a hernia that is difficult to repair.  Your organs are hard to see.  You have  bleeding problems during the laparoscopic procedure.  What happens before the procedure? Staying hydrated Follow instructions from your health care provider about hydration, which may include:  Up to 2 hours before the procedure - you may continue to drink clear liquids, such as water, clear fruit juice, black coffee, and plain tea.  Eating and drinking restrictions Follow instructions from your health care provider about eating and drinking, which may include:  8 hours before the procedure - stop eating heavy meals or foods such as meat, fried foods, or fatty foods.  6 hours before the procedure - stop eating light meals or foods, such as toast or cereal.  6 hours before the procedure - stop drinking milk or drinks that contain milk.  2 hours before the procedure - stop drinking clear liquids.  Medicines  Ask your health care provider about: ? Changing or stopping your regular medicines. This is especially important if you are taking diabetes medicines or blood thinners. ? Taking medicines such as aspirin and ibuprofen. These medicines can thin your blood. Do not take these medicines before your procedure if your health care provider instructs you not to.  You may be given antibiotic medicine to help prevent infection. General instructions  You may be asked to take a laxative or do an enema to empty your bowel before surgery (bowel prep).  Do not use any products that contain nicotine or tobacco, such as cigarettes and e-cigarettes. If you need help quitting, ask your health care provider.  You may need to have tests before the procedure, such as: ? Blood tests. ? Urine tests. ?  Abdominal ultrasound. ? Chest X-ray. ? Electrocardiogram (ECG).  Plan to have someone take you home from the hospital or clinic.  If you will be going home right after the procedure, plan to have someone with you for 24 hours. What happens during the procedure?  To reduce your risk of  infection: ? Your health care team will wash or sanitize their hands. ? Your skin will be washed with soap.  An IV tube will be inserted into one of your veins.  You will be given one or more of the following: ? A medicine to help you relax (sedative). ? A medicine to make you fall asleep (general anesthetic).  A small incision will be made in your abdomen. A hollow metal tube (trocar) will be placed through the incision.  A tube will be placed through the trocar to inflate your abdomen with air-like gas. This makes it easier for your surgeon to see inside your abdomen and do the repair.  The laparoscope will be inserted into your abdomen through the trocar. The laparoscope will send images to a monitor in the operating room.  Other trocars will be put through other small incisions in your abdomen. The surgical instruments needed for the procedure will be placed through these trocars.  The tissue or intestines that make up the hernia will be moved back into place.  The edges of the hernia may be stitched together.  A piece of mesh will be used to close the hernia. Stitches (sutures), clips, or staples will be used to keep the mesh in place.  A bandage (dressing) or skin glue will be put over the incisions. The procedure may vary among health care providers and hospitals. What happens after the procedure?  Your blood pressure, heart rate, breathing rate, and blood oxygen level will be monitored until the medicines you were given have worn off.  You will continue to receive fluids and medicines through an IV tube. Your IV tube will be removed when you can drink clear fluids.  You will be given pain medicine as needed.  You will be encouraged to get up and walk around as soon as possible.  You may have to wear compression stockings. These stockings help to prevent blood clots and reduce swelling in your legs.  You will be shown how to do deep breathing exercises to help prevent a  lung infection.  Do not drive for 24 hours if you were given a sedative. This information is not intended to replace advice given to you by your health care provider. Make sure you discuss any questions you have with your health care provider. Document Released: 02/29/2012 Document Revised: 10/30/2015 Document Reviewed: 10/30/2015 Elsevier Interactive Patient Education  2018 [employer]  

## 2017-06-30 NOTE — Progress Notes (Signed)
Outpatient Surgical Follow Up  06/30/2017  Alex Malone is an 84 y.o. male.   UR:[id number] hernia  HPI: This patient has multiple recurrent ventral hernia.  He has been to the ER again with signs of a possible bowel obstruction which was reducible and corrected by Dr. Hampton Abbot.  I have seen him in the past and recommended surgery and he has refused. Now he thinks he might want to have surgery.  He has had no further nausea or vomiting since Monday night when it was reduced in the ER.  He is having diarrhea for the first time today.  He has no pain at this time and no nausea or vomiting.  Past Medical History:  Diagnosis Date  . History of lacunar cerebrovascular accident (CVA)   . History of prostate cancer   . History of subdural hematoma   . Hyperlipidemia   . Hyperlipidemia, unspecified 11/25/2012  . Hypertension   . Hyperuricemia     Past Surgical History:  Procedure Laterality Date  . CHOLECYSTECTOMY    . EVALUATION UNDER ANESTHESIA WITH HEMORRHOIDECTOMY    . PERINEAL PROSTATECTOMY    . subdermal hematoma      Family History  Problem Relation Age of Onset  . Kidney disease Mother   . Cancer Father     Social History:  reports that he has never smoked. He has never used smokeless tobacco. He reports that he does not drink alcohol or use drugs.  Allergies: No Known Allergies  Medications reviewed.   Review of Systems:   Review of Systems  Constitutional: Negative.   HENT: Negative.   Eyes: Negative.   Respiratory: Negative.   Cardiovascular: Negative.   Gastrointestinal: Positive for abdominal pain, diarrhea, nausea and vomiting. Negative for blood in stool, constipation, heartburn and melena.  Genitourinary: Negative.   Musculoskeletal: Negative.   Skin: Negative.   Neurological: Negative.   Endo/Heme/Allergies: Negative.   Psychiatric/Behavioral: Negative.      Physical Exam:  Ht 6\' 2"  (1.88 m)   BMI 25.94 kg/m   Physical Exam  Constitutional: He is  oriented to person, place, and time and well-developed, well-nourished, and in no distress. No distress.  HENT:  Head: Normocephalic and atraumatic.  Eyes: Pupils are equal, round, and reactive to light. Right eye exhibits no discharge. Left eye exhibits no discharge. No scleral icterus.  Neck: Normal range of motion. No JVD present.  Cardiovascular: Normal rate, regular rhythm and normal heart sounds.  Pulmonary/Chest: Effort normal and breath sounds normal.  Abdominal: Soft. He exhibits no distension. There is no tenderness. There is no rebound and no guarding.  Soft hernia to the left of midline cephalad to the midline incision.  It is easily reducible and nontender.  Musculoskeletal: Normal range of motion. He exhibits no edema or tenderness.  Lymphadenopathy:    He has no cervical adenopathy.  Neurological: He is alert and oriented to person, place, and time.  Skin: Skin is warm and dry. No rash noted. He is not diaphoretic. No erythema.  Psychiatric: Mood and affect normal.  Vitals reviewed.     No results found for this or any previous visit (from the past 48 hour(s)). No results found.  Assessment/Plan:  Recurrent ventral hernia.  He has experienced bowel obstruction recently and was in the emergency room where the hernia was reduced by the surgeon on-call, Dr. Hampton Abbot.  Previously on multiple visits the patient has refused to entertain surgical intervention at this time. I recommend an abdominal binder until we  can get this scheduled.  If he were to worsen between now and when a scheduled operation is performed he would need to go to the emergency room.  I discussed with he and his family member the rationale for this again as I have previously.  I discussed with him the risk of bleeding infection mesh placement mesh infection scar tissue and bowel injury they understood and agreed to proceed Florene Glen, MD, FACS

## 2017-07-03 ENCOUNTER — Telehealth: Payer: Self-pay | Admitting: General Practice

## 2017-07-03 NOTE — Telephone Encounter (Signed)
Patient called and left a voicemail asking if we have decided on a surgery date for him and is asking for someone to give him a call. Please call patient and advise.

## 2017-07-03 NOTE — Telephone Encounter (Signed)
Patients daughter Tracie Harrier is calling again asking if you could call her at [phone].

## 2017-07-06 NOTE — Telephone Encounter (Signed)
I have called patients daughter back.

## 2017-07-07 ENCOUNTER — Telehealth: Payer: Self-pay | Admitting: Surgery

## 2017-07-07 ENCOUNTER — Other Ambulatory Visit: Payer: Self-pay

## 2017-07-07 ENCOUNTER — Encounter
Admission: RE | Admit: 2017-07-07 | Discharge: 2017-07-07 | Disposition: A | Discharge status: Home / Self Care | Payer: Medicare Other | Source: Ambulatory Visit | Attending: Surgery | Admitting: Surgery

## 2017-07-07 DIAGNOSIS — I10 Essential (primary) hypertension: Secondary | ICD-10-CM | POA: Diagnosis not present

## 2017-07-07 DIAGNOSIS — Z01818 Encounter for other preprocedural examination: Secondary | ICD-10-CM | POA: Diagnosis present

## 2017-07-07 DIAGNOSIS — E785 Hyperlipidemia, unspecified: Secondary | ICD-10-CM

## 2017-07-07 DIAGNOSIS — K439 Ventral hernia without obstruction or gangrene: Secondary | ICD-10-CM | POA: Insufficient documentation

## 2017-07-07 DIAGNOSIS — I4510 Unspecified right bundle-branch block: Secondary | ICD-10-CM | POA: Diagnosis not present

## 2017-07-07 DIAGNOSIS — I440 Atrioventricular block, first degree: Secondary | ICD-10-CM | POA: Diagnosis not present

## 2017-07-07 DIAGNOSIS — R001 Bradycardia, unspecified: Secondary | ICD-10-CM | POA: Insufficient documentation

## 2017-07-07 HISTORY — DX: Anxiety disorder, unspecified: F41.9

## 2017-07-07 NOTE — Telephone Encounter (Signed)
Pt advised of pre op date/time and sx date. Sx: 07/11/17 with Dr Marcellus Scott hernia repair.  Pre op: 07/07/17 @ 12:15pm--office interview.   Patient made aware to call [phone], between 1-3:00pm the day before surgery, to find out what time to arrive.

## 2017-07-07 NOTE — Patient Instructions (Signed)
Your procedure is scheduled on: Tues 07/11/17 Report to [place]. To find out your arrival time please call [phone] between 1PM - 3PM on Mon 07/10/17.  Remember: Instructions that are not followed completely may result in serious medical risk, up to and including death, or upon the discretion of your surgeon and anesthesiologist your surgery may need to be rescheduled.     _X__ 1. Do not eat food after midnight the night before your procedure.                 No gum chewing or hard candies. You may drink clear liquids up to 2 hours                 before you are scheduled to arrive for your surgery- DO not drink clear                 liquids within 2 hours of the start of your surgery.                 Clear Liquids include:  water, apple juice without pulp, clear carbohydrate                 drink such as Clearfast or Gatorade, Black Coffee or Tea (Do not add                 anything to coffee or tea).  __X__2.  On the morning of surgery brush your teeth with toothpaste and water, you                 may rinse your mouth with mouthwash if you wish.  Do not swallow any              toothpaste of mouthwash.     _X__ 3.  No Alcohol for 24 hours before or after surgery.   _X__ 4.  Do Not Smoke or use e-cigarettes For 24 Hours Prior to Your Surgery.                 Do not use any chewable tobacco products for at least 6 hours prior to                 surgery.  ____  5.  Bring all medications with you on the day of surgery if instructed.   __X__  6.  Notify your doctor if there is any change in your medical condition      (cold, fever, infections).     Do not wear jewelry, make-up, hairpins, clips or nail polish. Do not wear lotions, powders, or perfumes.  Do not shave 48 hours prior to surgery. Men may shave face and neck. Do not bring valuables to the hospital.    [facility] is not responsible for any belongings or  valuables.  Contacts, dentures/partials or body piercings may not be worn into surgery. Bring a case for your contacts, glasses or hearing aids, a denture cup will be supplied. Leave your suitcase in the car. After surgery it may be brought to your room. For patients admitted to the hospital, discharge time is determined by your treatment team.   Patients discharged the day of surgery will not be allowed to drive home.   Please read over the following fact sheets that you were given:   MRSA Information  __X__ Take these medicines the morning of surgery with A SIP OF WATER:  1. May take your Diazepam/Valium if needed for anxiety  2.   3.   4.  5.  6.  ____ Fleet Enema (as directed)   __X__ Use CHG Soap/SAGE wipes as directed  ____ Use inhalers on the day of surgery  ____ Stop metformin/Janumet/Farxiga 2 days prior to surgery    ____ Take 1/2 of usual insulin dose the night before surgery. No insulin the morning          of surgery.   ____ Stop Blood Thinners Coumadin/Plavix/Xarelto/Pleta/Pradaxa/Eliquis/Effient/Aspirin  on TODAY  Or contact your Surgeon, Cardiologist or Medical Doctor regarding  ability to stop your blood thinners  __X__ Stop Anti-inflammatories 7 days before surgery such as Advil, Ibuprofen, Motrin,  BC or Goodies Powder, Naprosyn, Naproxen, Aleve, Aspirin    __X__ Stop all herbal supplements, fish oil or vitamin E until after surgery.    ____ Bring C-Pap to the hospital.

## 2017-07-11 ENCOUNTER — Encounter
Admission: RE | Disposition: A | Discharge status: Home / Self Care | Payer: Self-pay | Source: Ambulatory Visit | Attending: Surgery

## 2017-07-11 ENCOUNTER — Inpatient Hospital Stay
Admission: RE | Admit: 2017-07-11 | Discharge: 2017-07-13 | DRG: 355 | Disposition: A | Discharge status: Home / Self Care | Payer: Medicare Other | Source: Ambulatory Visit | Attending: Surgery | Admitting: Surgery

## 2017-07-11 ENCOUNTER — Encounter: Payer: Self-pay | Admitting: Anesthesiology

## 2017-07-11 ENCOUNTER — Other Ambulatory Visit: Payer: Self-pay

## 2017-07-11 ENCOUNTER — Inpatient Hospital Stay: Payer: Medicare Other | Admitting: Anesthesiology

## 2017-07-11 DIAGNOSIS — Z8673 Personal history of transient ischemic attack (TIA), and cerebral infarction without residual deficits: Secondary | ICD-10-CM

## 2017-07-11 DIAGNOSIS — K660 Peritoneal adhesions (postprocedural) (postinfection): Secondary | ICD-10-CM | POA: Diagnosis present

## 2017-07-11 DIAGNOSIS — K432 Incisional hernia without obstruction or gangrene: Principal | ICD-10-CM | POA: Diagnosis present

## 2017-07-11 DIAGNOSIS — K439 Ventral hernia without obstruction or gangrene: Secondary | ICD-10-CM

## 2017-07-11 DIAGNOSIS — I10 Essential (primary) hypertension: Secondary | ICD-10-CM | POA: Diagnosis present

## 2017-07-11 HISTORY — PX: VENTRAL HERNIA REPAIR: SHX424

## 2017-07-11 HISTORY — PX: INSERTION OF MESH: SHX5868

## 2017-07-11 LAB — CBC
HCT: 48.1 % (ref 40.0–52.0)
HEMOGLOBIN: 16.5 g/dL (ref 13.0–18.0)
MCH: 30.7 pg (ref 26.0–34.0)
MCHC: 34.2 g/dL (ref 32.0–36.0)
MCV: 89.8 fL (ref 80.0–100.0)
PLATELETS: 195 10*3/uL (ref 150–440)
RBC: 5.36 MIL/uL (ref 4.40–5.90)
RDW: 14 % (ref 11.5–14.5)
WBC: 10.7 10*3/uL — ABNORMAL HIGH (ref 3.8–10.6)

## 2017-07-11 LAB — CREATININE, SERUM
CREATININE: 0.92 mg/dL (ref 0.61–1.24)
GFR calc Af Amer: >60 mL/min (ref >60)

## 2017-07-11 SURGERY — REPAIR, HERNIA, VENTRAL
Anesthesia: General | Site: Abdomen | Wound class: Clean

## 2017-07-11 MED ORDER — SODIUM CHLORIDE 0.9% FLUSH
3.0000 mL | Freq: Two times a day (BID) | INTRAVENOUS | Status: DC
Start: 2017-07-11 — End: 2017-07-13
  Administered 2017-07-11 – 2017-07-13 (×3): 3 mL via INTRAVENOUS

## 2017-07-11 MED ORDER — SUGAMMADEX SODIUM 200 MG/2ML IV SOLN
INTRAVENOUS | Status: AC
  Filled 2017-07-11: qty 4

## 2017-07-11 MED ORDER — SUGAMMADEX SODIUM 200 MG/2ML IV SOLN
INTRAVENOUS | Status: DC | PRN
  Administered 2017-07-11: 200 mg via INTRAVENOUS

## 2017-07-11 MED ORDER — LIDOCAINE HCL (PF) 2 % IJ SOLN
INTRAMUSCULAR | Status: AC
  Filled 2017-07-11: qty 10

## 2017-07-11 MED ORDER — CHLORHEXIDINE GLUCONATE CLOTH 2 % EX PADS: 6.0000 | MEDICATED_PAD | Freq: Once | CUTANEOUS | Status: DC

## 2017-07-11 MED ORDER — PROPOFOL 10 MG/ML IV BOLUS
INTRAVENOUS | Status: AC
  Filled 2017-07-11: qty 20

## 2017-07-11 MED ORDER — HEPARIN SODIUM (PORCINE) 5000 UNIT/ML IJ SOLN
5000.0000 [IU] | Freq: Three times a day (TID) | INTRAMUSCULAR | Status: DC
  Administered 2017-07-11 – 2017-07-13 (×5): 5000 [IU] via SUBCUTANEOUS
  Filled 2017-07-11 (×5): qty 1

## 2017-07-11 MED ORDER — ONDANSETRON HCL 4 MG/2ML IJ SOLN
INTRAMUSCULAR | Status: AC
  Filled 2017-07-11: qty 2

## 2017-07-11 MED ORDER — BRIMONIDINE TARTRATE 0.2 % OP SOLN
1.0000 [drp] | Freq: Two times a day (BID) | OPHTHALMIC | Status: DC
  Administered 2017-07-11 – 2017-07-13 (×4): 1 [drp] via OPHTHALMIC
  Filled 2017-07-11: qty 5

## 2017-07-11 MED ORDER — FENTANYL CITRATE (PF) 100 MCG/2ML IJ SOLN
INTRAMUSCULAR | Status: AC
  Administered 2017-07-11: 25 ug via INTRAVENOUS
  Filled 2017-07-11: qty 2

## 2017-07-11 MED ORDER — CEFAZOLIN SODIUM-DEXTROSE 2-4 GM/100ML-% IV SOLN
2.0000 g | INTRAVENOUS | Status: AC
  Administered 2017-07-11: 2 g via INTRAVENOUS
  Filled 2017-07-11: qty 100

## 2017-07-11 MED ORDER — BUPIVACAINE-EPINEPHRINE (PF) 0.25% -1:200000 IJ SOLN
INTRAMUSCULAR | Status: DC | PRN
  Administered 2017-07-11: 30 mL via PERINEURAL

## 2017-07-11 MED ORDER — GLYCOPYRROLATE 0.2 MG/ML IJ SOLN
INTRAMUSCULAR | Status: AC
  Filled 2017-07-11: qty 1

## 2017-07-11 MED ORDER — ACETAMINOPHEN 10 MG/ML IV SOLN
INTRAVENOUS | Status: DC | PRN
  Administered 2017-07-11: 1000 mg via INTRAVENOUS

## 2017-07-11 MED ORDER — LIDOCAINE HCL (CARDIAC) 20 MG/ML IV SOLN
INTRAVENOUS | Status: DC | PRN
  Administered 2017-07-11: 100 mg via INTRAVENOUS

## 2017-07-11 MED ORDER — ACETAMINOPHEN 10 MG/ML IV SOLN
INTRAVENOUS | Status: AC
  Filled 2017-07-11: qty 100

## 2017-07-11 MED ORDER — FAMOTIDINE 20 MG PO TABS
20.0000 mg | ORAL_TABLET | Freq: Once | ORAL | Status: AC
  Administered 2017-07-11: 20 mg via ORAL

## 2017-07-11 MED ORDER — FENTANYL CITRATE (PF) 100 MCG/2ML IJ SOLN
25.0000 ug | INTRAMUSCULAR | Status: AC | PRN
  Administered 2017-07-11 (×6): 25 ug via INTRAVENOUS

## 2017-07-11 MED ORDER — SUCCINYLCHOLINE CHLORIDE 20 MG/ML IJ SOLN
INTRAMUSCULAR | Status: AC
  Filled 2017-07-11: qty 1

## 2017-07-11 MED ORDER — FAMOTIDINE 20 MG PO TABS
ORAL_TABLET | ORAL | Status: AC
  Administered 2017-07-11: 20 mg via ORAL
  Filled 2017-07-11: qty 1

## 2017-07-11 MED ORDER — LACTATED RINGERS IV SOLN
INTRAVENOUS | Status: DC
  Administered 2017-07-11: 13:00:00 via INTRAVENOUS

## 2017-07-11 MED ORDER — DEXAMETHASONE SODIUM PHOSPHATE 4 MG/ML IJ SOLN
INTRAMUSCULAR | Status: DC | PRN
  Administered 2017-07-11: 4 mg via INTRAVENOUS

## 2017-07-11 MED ORDER — ROCURONIUM BROMIDE 50 MG/5ML IV SOLN
INTRAVENOUS | Status: AC
  Filled 2017-07-11: qty 1

## 2017-07-11 MED ORDER — ONDANSETRON HCL 4 MG/2ML IJ SOLN: 4.0000 mg | Freq: Four times a day (QID) | INTRAMUSCULAR | Status: DC | PRN

## 2017-07-11 MED ORDER — MORPHINE SULFATE (PF) 4 MG/ML IV SOLN
2.0000 mg | INTRAVENOUS | Status: DC | PRN
  Administered 2017-07-11 – 2017-07-12 (×5): 2 mg via INTRAVENOUS
  Filled 2017-07-11 (×5): qty 1

## 2017-07-11 MED ORDER — PROPOFOL 10 MG/ML IV BOLUS
INTRAVENOUS | Status: DC | PRN
  Administered 2017-07-11: 200 mg via INTRAVENOUS

## 2017-07-11 MED ORDER — ONDANSETRON HCL 4 MG/2ML IJ SOLN: 4.0000 mg | Freq: Once | INTRAMUSCULAR | Status: DC | PRN

## 2017-07-11 MED ORDER — LISINOPRIL 10 MG PO TABS
10.0000 mg | ORAL_TABLET | Freq: Every day | ORAL | Status: DC
  Administered 2017-07-12 – 2017-07-13 (×2): 10 mg via ORAL
  Filled 2017-07-11 (×2): qty 1

## 2017-07-11 MED ORDER — DEXMEDETOMIDINE HCL IN NACL 200 MCG/50ML IV SOLN
INTRAVENOUS | Status: DC | PRN
  Administered 2017-07-11: 12 ug via INTRAVENOUS

## 2017-07-11 MED ORDER — DIAZEPAM 2 MG PO TABS
2.0000 mg | ORAL_TABLET | Freq: Four times a day (QID) | ORAL | Status: DC | PRN
  Administered 2017-07-12: 2 mg via ORAL
  Filled 2017-07-11: qty 1

## 2017-07-11 MED ORDER — FENTANYL CITRATE (PF) 100 MCG/2ML IJ SOLN
50.0000 ug | Freq: Once | INTRAMUSCULAR | Status: AC
  Administered 2017-07-11: 50 ug via INTRAVENOUS

## 2017-07-11 MED ORDER — SIMVASTATIN 20 MG PO TABS
20.0000 mg | ORAL_TABLET | Freq: Every day | ORAL | Status: DC
  Administered 2017-07-11 – 2017-07-12 (×2): 20 mg via ORAL
  Filled 2017-07-11 (×3): qty 1

## 2017-07-11 MED ORDER — ONDANSETRON HCL 4 MG/2ML IJ SOLN
INTRAMUSCULAR | Status: DC | PRN
  Administered 2017-07-11: 4 mg via INTRAVENOUS

## 2017-07-11 MED ORDER — ROCURONIUM BROMIDE 100 MG/10ML IV SOLN
INTRAVENOUS | Status: DC | PRN
  Administered 2017-07-11: 30 mg via INTRAVENOUS
  Administered 2017-07-11: 20 mg via INTRAVENOUS

## 2017-07-11 MED ORDER — HEPARIN SODIUM (PORCINE) 5000 UNIT/ML IJ SOLN
INTRAMUSCULAR | Status: AC
  Administered 2017-07-11: 5000 [IU] via SUBCUTANEOUS
  Filled 2017-07-11: qty 1

## 2017-07-11 MED ORDER — CEFAZOLIN SODIUM-DEXTROSE 2-3 GM-%(50ML) IV SOLR
INTRAVENOUS | Status: AC
  Filled 2017-07-11: qty 50

## 2017-07-11 MED ORDER — MIDAZOLAM HCL 2 MG/2ML IJ SOLN
INTRAMUSCULAR | Status: AC
  Filled 2017-07-11: qty 2

## 2017-07-11 MED ORDER — SODIUM CHLORIDE 0.9 % IV SOLN: 250.0000 mL | INTRAVENOUS | Status: DC | PRN

## 2017-07-11 MED ORDER — LATANOPROST 0.005 % OP SOLN
1.0000 [drp] | Freq: Every day | OPHTHALMIC | Status: DC
  Administered 2017-07-11 – 2017-07-12 (×2): 1 [drp] via OPHTHALMIC
  Filled 2017-07-11: qty 2.5

## 2017-07-11 MED ORDER — HEPARIN SODIUM (PORCINE) 5000 UNIT/ML IJ SOLN
5000.0000 [IU] | Freq: Once | INTRAMUSCULAR | Status: AC
  Administered 2017-07-11: 5000 [IU] via SUBCUTANEOUS

## 2017-07-11 MED ORDER — GLYCOPYRROLATE 0.2 MG/ML IJ SOLN
INTRAMUSCULAR | Status: DC | PRN
  Administered 2017-07-11: 0.2 mg via INTRAVENOUS

## 2017-07-11 MED ORDER — TRAMADOL HCL 50 MG PO TABS
50.0000 mg | ORAL_TABLET | Freq: Three times a day (TID) | ORAL | Status: DC | PRN
Start: 2017-07-11 — End: 2017-07-13
  Administered 2017-07-12 – 2017-07-13 (×3): 50 mg via ORAL
  Filled 2017-07-11 (×3): qty 1

## 2017-07-11 MED ORDER — SUGAMMADEX SODIUM 200 MG/2ML IV SOLN
INTRAVENOUS | Status: AC
  Filled 2017-07-11: qty 2

## 2017-07-11 MED ORDER — DORZOLAMIDE HCL-TIMOLOL MAL 2-0.5 % OP SOLN
1.0000 [drp] | Freq: Two times a day (BID) | OPHTHALMIC | Status: DC
  Administered 2017-07-11 – 2017-07-13 (×4): 1 [drp] via OPHTHALMIC
  Filled 2017-07-11: qty 10

## 2017-07-11 MED ORDER — ONDANSETRON 4 MG PO TBDP: 4.0000 mg | ORAL_TABLET | Freq: Three times a day (TID) | ORAL | Status: DC | PRN

## 2017-07-11 MED ORDER — FENTANYL CITRATE (PF) 100 MCG/2ML IJ SOLN
INTRAMUSCULAR | Status: AC
  Filled 2017-07-11: qty 2

## 2017-07-11 MED ORDER — BUPIVACAINE-EPINEPHRINE (PF) 0.25% -1:200000 IJ SOLN
INTRAMUSCULAR | Status: AC
  Filled 2017-07-11: qty 30

## 2017-07-11 MED ORDER — ONDANSETRON HCL 4 MG PO TABS: 4.0000 mg | ORAL_TABLET | Freq: Four times a day (QID) | ORAL | Status: DC | PRN

## 2017-07-11 MED ORDER — FENTANYL CITRATE (PF) 100 MCG/2ML IJ SOLN
INTRAMUSCULAR | Status: DC | PRN
  Administered 2017-07-11 (×4): 50 ug via INTRAVENOUS

## 2017-07-11 MED ORDER — SODIUM CHLORIDE 0.9% FLUSH
3.0000 mL | INTRAVENOUS | Status: DC | PRN
  Administered 2017-07-11 – 2017-07-12 (×2): 3 mL via INTRAVENOUS
  Filled 2017-07-11 (×2): qty 3

## 2017-07-11 SURGICAL SUPPLY — 34 items
ADHESIVE MASTISOL STRL (MISCELLANEOUS) ×3 IMPLANT
CANISTER SUCT 1200ML W/VALVE (MISCELLANEOUS) ×3 IMPLANT
CHLORAPREP W/TINT 26ML (MISCELLANEOUS) ×3 IMPLANT
CLOSURE WOUND 1/2 X4 (GAUZE/BANDAGES/DRESSINGS) ×1
COUNTER NEEDLE 20/40 LG (NEEDLE) ×3 IMPLANT
DRAPE LAPAROTOMY 100X77 ABD (DRAPES) ×3 IMPLANT
DRSG OPSITE POSTOP 4X8 (GAUZE/BANDAGES/DRESSINGS) ×3 IMPLANT
ELECT REM PT RETURN 9FT ADLT (ELECTROSURGICAL) ×3
ELECTRODE REM PT RTRN 9FT ADLT (ELECTROSURGICAL) ×1 IMPLANT
ETHIBOND 2 0 GREEN CT 2 30IN (SUTURE) ×6 IMPLANT
GAUZE SPONGE 4X4 12PLY STRL (GAUZE/BANDAGES/DRESSINGS) ×3 IMPLANT
GAUZE SPONGE 4X4 16PLY XRAY LF (GAUZE/BANDAGES/DRESSINGS) ×3 IMPLANT
GLOVE BIO SURGEON STRL SZ8 (GLOVE) ×6 IMPLANT
GOWN STRL REUS W/ TWL LRG LVL3 (GOWN DISPOSABLE) ×2 IMPLANT
GOWN STRL REUS W/TWL LRG LVL3 (GOWN DISPOSABLE) ×4
KIT TURNOVER KIT A (KITS) ×3 IMPLANT
LABEL OR SOLS (LABEL) ×3 IMPLANT
MESH VENT ST 11X14CM M OVL (Mesh General) ×3 IMPLANT
NEEDLE HYPO 22GX1.5 SAFETY (NEEDLE) ×3 IMPLANT
NS IRRIG 500ML POUR BTL (IV SOLUTION) ×3 IMPLANT
PACK BASIN MINOR ARMC (MISCELLANEOUS) ×3 IMPLANT
SPONGE LAP 18X18 5 PK (GAUZE/BANDAGES/DRESSINGS) ×6 IMPLANT
STAPLER SKIN PROX 35W (STAPLE) ×3 IMPLANT
STRIP CLOSURE SKIN 1/2X4 (GAUZE/BANDAGES/DRESSINGS) ×2 IMPLANT
SUT MNCRL 4-0 (SUTURE) ×2
SUT MNCRL 4-0 27XMFL (SUTURE) ×1
SUT PROLENE 0 CT 1 30 (SUTURE) ×9 IMPLANT
SUT PROLENE 0 CT 1 CR/8 (SUTURE) ×6 IMPLANT
SUT SILK 3-0 (SUTURE) ×6 IMPLANT
SUT VIC AB 0 CT2 27 (SUTURE) ×6 IMPLANT
SUT VIC AB 3-0 SH 27 (SUTURE) ×2
SUT VIC AB 3-0 SH 27X BRD (SUTURE) ×1 IMPLANT
SUTURE MNCRL 4-0 27XMF (SUTURE) ×1 IMPLANT
SYR 10ML LL (SYRINGE) ×3 IMPLANT

## 2017-07-11 NOTE — Transfer of Care (Signed)
Immediate Anesthesia Transfer of Care Note  Patient: Alex Malone  Procedure(s) Performed: HERNIA REPAIR RECURRENT VENTRAL ADULT (N/A Abdomen) INSERTION OF MESH (N/A Abdomen)  Patient Location: PACU  Anesthesia Type:General  Level of Consciousness: sedated  Airway & Oxygen Therapy: Patient Spontanous Breathing and Patient connected to face mask oxygen  Post-op Assessment: Report given to RN and Post -op Vital signs reviewed and stable  Post vital signs: Reviewed  Last Vitals:  Vitals Value Taken Time  BP 113/82 07/11/2017  2:47 PM  Temp    Pulse 75 07/11/2017  2:48 PM  Resp 12 07/11/2017  2:48 PM  SpO2 98 % 07/11/2017  2:48 PM  Vitals shown include unvalidated device data.  Last Pain:  Vitals:   07/11/17 1214  TempSrc: Oral  PainSc: 0-No pain         Complications: No apparent anesthesia complications

## 2017-07-11 NOTE — Op Note (Signed)
Abdominal Hernia Repair  Pre-operative Diagnosis: Recurrent ventral hernia  Post-operative Diagnosis: Recurrent ventral hernia  Surgeon: Jerrol Banana. Burt Knack, MD FACS  Anesthesia: Gen. with endotracheal tube  Assistant: Surgical tech  Repair of recurrent ventral hernia with mesh  Procedure Details  The patient was seen again in the Holding Room. The benefits, complications, treatment options, and expected outcomes were discussed with the patient. The risks of bleeding, infection, recurrence of symptoms, failure to resolve symptoms, bowel injury, mesh placement, mesh infection, any of which could require further surgery were reviewed with the patient. The likelihood of improving the patient's symptoms with return to their baseline status is good.  The patient and/or family concurred with the proposed plan, giving informed consent.  The patient was taken to Operating Room, identified as Alex Malone and the procedure verified.  A Time Out was held and the above information confirmed.  Prior to the induction of general anesthesia, antibiotic prophylaxis was administered. VTE prophylaxis was in place. General endotracheal anesthesia was then administered and tolerated well. After the induction, the abdomen was prepped with Chloraprep and draped in the sterile fashion. The patient was positioned in the supine position.  A midline incision was utilized to open explore the abdominal cavity.  This was extended cephalad above the previous incision to allow opening of the abdominal cavity away from prior mesh activity.  The hernia was noted to be to the left of midline in the upper extent of the abdomen.  Extensive adhesio lysis was performed sharply.  This was done without the use of energy except for hemostasis on areas away from bowel.  The colon appeared to be densely adherent to mesh.  Small serosal tears were identified and closed with Lembert sutures of 3-0 silk.  No sign of luminal entry was  identified.  This was apparent in 2 separate areas where closure of the serosa was necessary.  In other areas where the density of the adherence was so tight that the mesh was cut away from the abdominal wall and left in contact with the bowel.  Once this was freed up completely the rent was measured and an 11 x 14 Ventrio coated mesh was placed into the abdominal cavity.  It was held in with U sutures of 0 Prolene, figure-of-eight sutures of 0 Prolene as well.  Once this repair was adequate the fascia was closed over the top of the mesh with figure-of-eight 0 Prolenes.  A layer of 0 Vicryl was placed.  This adequately isolated the mesh from the outside spaces.  Marcaine was infiltrated into the subcutaneous tissues for a total of 30 cc.  Skin staples were then placed as was a dressing.  Patient tolerated the procedure well there were no complications he was taken to recovery room in stable condition to be admitted for continued care sponge lap needle count was correct.  Findings:  colon Densely adherent to mesh in multiple places.  Mesh left attached to: And dissected off of abdominal wall.  Estimated Blood Loss: 25 cc         Drains: None         Specimens: None       Complications: none               Airik Goodlin E. Burt Knack, MD, FACS

## 2017-07-11 NOTE — Anesthesia Preprocedure Evaluation (Addendum)
Anesthesia Evaluation  Patient identified by MRN, date of birth, ID band Patient awake    Reviewed: Allergy & Precautions, NPO status , Patient's Chart, lab work & pertinent test results, reviewed documented beta blocker date and time   Airway Mallampati: III  TM Distance: >3 FB     Dental  (+) Chipped   Pulmonary           Cardiovascular hypertension, Pt. on medications      Neuro/Psych Anxiety CVA    GI/Hepatic   Endo/Other    Renal/GU      Musculoskeletal   Abdominal   Peds  Hematology   Anesthesia Other Findings EKG shows Rbbb. Had QRS widening in the past. No cardiac symptoms.  Reproductive/Obstetrics                            Anesthesia Physical Anesthesia Plan  ASA: III  Anesthesia Plan: General   Post-op Pain Management:    Induction: Intravenous  PONV Risk Score and Plan:   Airway Management Planned: Oral ETT  Additional Equipment:   Intra-op Plan:   Post-operative Plan:   Informed Consent: I have reviewed the patients History and Physical, chart, labs and discussed the procedure including the risks, benefits and alternatives for the proposed anesthesia with the patient or authorized representative who has indicated his/her understanding and acceptance.     Plan Discussed with: CRNA  Anesthesia Plan Comments:         Anesthesia Quick Evaluation

## 2017-07-11 NOTE — Anesthesia Post-op Follow-up Note (Signed)
Anesthesia QCDR form completed.        

## 2017-07-11 NOTE — Progress Notes (Signed)
Preoperative Review   Patient is met in the preoperative holding area. The history is reviewed in the chart and with the patient. I personally reviewed the options and rationale as well as the risks of this procedure that have been previously discussed with the patient. All questions asked by the patient and/or family were answered to their satisfaction.  Patient agrees to proceed with this procedure at this time.  Elin Seats E Talmadge Ganas M.D. FACS  

## 2017-07-11 NOTE — Anesthesia Procedure Notes (Signed)
Procedure Name: Intubation Performed by: Gunnar Bulla, MD Pre-anesthesia Checklist: Patient identified, Emergency Drugs available, Suction available, Patient being monitored and Timeout performed Patient Re-evaluated:Patient Re-evaluated prior to induction Oxygen Delivery Method: Circle system utilized Preoxygenation: Pre-oxygenation with 100% oxygen Induction Type: IV induction Ventilation: Mask ventilation without difficulty Laryngoscope Size: Miller and 3 Grade View: Grade I Tube type: Oral Tube size: 7.5 mm Number of attempts: 1 Airway Equipment and Method: Patient positioned with wedge pillow and Stylet Placement Confirmation: ETT inserted through vocal cords under direct vision,  positive ETCO2 and breath sounds checked- equal and bilateral Secured at: 23 cm Tube secured with: Tape Dental Injury: Teeth and Oropharynx as per pre-operative assessment

## 2017-07-12 ENCOUNTER — Encounter: Payer: Self-pay | Admitting: Surgery

## 2017-07-12 NOTE — Anesthesia Postprocedure Evaluation (Signed)
Anesthesia Post Note  Patient: Alex Malone  Procedure(Malone) Performed: HERNIA REPAIR RECURRENT VENTRAL ADULT (N/A Abdomen) INSERTION OF MESH (N/A Abdomen)  Patient location during evaluation: PACU Anesthesia Type: General Level of consciousness: awake and alert Pain management: pain level controlled Vital Signs Assessment: post-procedure vital signs reviewed and stable Respiratory status: spontaneous breathing, nonlabored ventilation, respiratory function stable and patient connected to nasal cannula oxygen Cardiovascular status: blood pressure returned to baseline and stable Postop Assessment: no apparent nausea or vomiting Anesthetic complications: no     Last Vitals:  Vitals:   07/11/17 2338 07/12/17 0458  BP: (!) 132/94 120/80  Pulse: (!) 59 (!) 58  Resp: 18 20  Temp: (!) 36.4 C 36.4 C  SpO2: 97% 95%    Last Pain:  Vitals:   07/12/17 0458  TempSrc: Oral  PainSc:                  Alex Malone

## 2017-07-12 NOTE — Progress Notes (Signed)
1 Day Post-Op  Subjective: Patient status post repair of a recurrent ventral hernia with mesh.  There was extensive adhesions to the existing mesh which were taken down.  Today he has a fair amount of pain but is tolerating a diet with no nausea or vomiting.  Does not feel ready to go home yet.  Objective: Vital signs in last 24 hours: Temp:  [96.7 F (35.9 C)-98 F (36.7 C)] 98 F (36.7 C) (04/17 1207) Pulse Rate:  [58-77] 61 (04/17 1207) Resp:  [9-21] 19 (04/17 1207) BP: (111-149)/(75-99) 111/75 (04/17 1207) SpO2:  [90 %-100 %] 96 % (04/17 1207)    Intake/Output from previous day: 04/16 0701 - 04/17 0700 In: 2420 [P.O.:120; I.V.:1000; IV Piggyback:1300] Out: 900 [Urine:900] Intake/Output this shift: Total I/O In: 1526 [P.O.:480; I.V.:1046] Out: 500 [Urine:500]  Physical exam:  Vital signs reviewed afebrile abdomen is soft nondistended minimally tender around the incision.  No peritoneal signs.  Wound is dressed with honeycomb dressing with some sanguinous fluid beneath it which was present in PACU yesterday.  Lab Results: CBC  Recent Labs    07/11/17 1642  WBC 10.7*  HGB 16.5  HCT 48.1  PLT 195   BMET Recent Labs    07/11/17 1642  CREATININE 0.92   PT/INR No results for input(s): LABPROT, INR in the last 72 hours. ABG No results for input(s): PHART, HCO3 in the last 72 hours.  Invalid input(s): PCO2, PO2  Studies/Results: No results found.  Anti-infectives: Anti-infectives (From admission, onward)   Start     Dose/Rate Route Frequency Ordered Stop   07/11/17 1157  ceFAZolin (ANCEF) 2-3 GM-%(50ML) IVPB SOLR    Note to Pharmacy:  Myles Lipps   : cabinet override      07/11/17 1157 07/12/17 0014   07/11/17 0527  ceFAZolin (ANCEF) IVPB 2g/100 mL premix     2 g 200 mL/hr over 30 Minutes Intravenous On call to O.R. 07/11/17 0527 07/11/17 1323      Assessment/Plan: s/p Procedure(s): HERNIA REPAIR RECURRENT VENTRAL ADULT INSERTION OF  MESH   Patient is doing quite well will advance diet today and convert saline lock.  We home tomorrow this was discussed with family.  Florene Glen, MD, FACS  07/12/2017

## 2017-07-13 LAB — CBC WITH DIFFERENTIAL/PLATELET
Basophils Absolute: 0 10*3/uL (ref 0–0.1)
Basophils Relative: 1 %
EOS ABS: 0.2 10*3/uL (ref 0–0.7)
Eosinophils Relative: 2 %
HCT: 45.4 % (ref 40.0–52.0)
HEMOGLOBIN: 15.4 g/dL (ref 13.0–18.0)
LYMPHS PCT: 18 %
Lymphs Abs: 1.6 10*3/uL (ref 1.0–3.6)
MCH: 30.3 pg (ref 26.0–34.0)
MCHC: 33.9 g/dL (ref 32.0–36.0)
MCV: 89.5 fL (ref 80.0–100.0)
Monocytes Absolute: 0.7 10*3/uL (ref 0.2–1.0)
Monocytes Relative: 8 %
NEUTROS PCT: 71 %
Neutro Abs: 6.3 10*3/uL (ref 1.4–6.5)
Platelets: 176 10*3/uL (ref 150–440)
RBC: 5.07 MIL/uL (ref 4.40–5.90)
RDW: 13.8 % (ref 11.5–14.5)
WBC: 8.9 10*3/uL (ref 3.8–10.6)

## 2017-07-13 MED ORDER — OXYCODONE-ACETAMINOPHEN 5-325 MG PO TABS: 1.0000 | ORAL_TABLET | Freq: Four times a day (QID) | ORAL | 0 refills | Status: DC | PRN

## 2017-07-13 NOTE — Progress Notes (Signed)
2 Days Post-Op  Subjective: Patient status post repair of a recurrent ventral hernia with mesh.  He feels well this morning and has no problems he is tolerating a regular diet.  Passing gas no fevers or chills  Objective: Vital signs in last 24 hours: Temp:  [97.9 F (36.6 C)-98.3 F (36.8 C)] 97.9 F (36.6 C) (04/18 0544) Pulse Rate:  [61-89] 83 (04/18 1023) Resp:  [16-19] 16 (04/18 0544) BP: (111-145)/(71-85) 145/71 (04/18 1023) SpO2:  [91 %-96 %] 92 % (04/18 1023)    Intake/Output from previous day: 04/17 0701 - 04/18 0700 In: 1846 [P.O.:800; I.V.:1046] Out: 1100 [Urine:1100] Intake/Output this shift: Total I/O In: 0  Out: 250 [Urine:250]  Physical exam:  Vital signs are stable afebrile appears comfortable.  Abdomen is soft nondistended nontympanitic and nontender.  Wound is clean without erythema honeycomb dressing is removed and there is no drainage.  Calves are non-tender  Lab Results: CBC  Recent Labs    07/11/17 1642 07/13/17 0546  WBC 10.7* 8.9  HGB 16.5 15.4  HCT 48.1 45.4  PLT 195 176   BMET Recent Labs    07/11/17 1642  CREATININE 0.92   PT/INR No results for input(s): LABPROT, INR in the last 72 hours. ABG No results for input(s): PHART, HCO3 in the last 72 hours.  Invalid input(s): PCO2, PO2  Studies/Results: No results found.  Anti-infectives: Anti-infectives (From admission, onward)   Start     Dose/Rate Route Frequency Ordered Stop   07/11/17 1157  ceFAZolin (ANCEF) 2-3 GM-%(50ML) IVPB SOLR    Note to Pharmacy:  Myles Lipps   : cabinet override      07/11/17 1157 07/12/17 0014   07/11/17 0527  ceFAZolin (ANCEF) IVPB 2g/100 mL premix     2 g 200 mL/hr over 30 Minutes Intravenous On call to O.R. 07/11/17 0527 07/11/17 1323      Assessment/Plan: s/p Procedure(s): HERNIA REPAIR RECURRENT VENTRAL ADULT INSERTION OF MESH   White blood cell count is normal.  Patient is doing quite well.  He is tolerating regular diet and is  ready for discharge today with Percocet for analgesia.  He will follow-up in my office in 10 days for staple removal.  Florene Glen, MD, FACS  07/13/2017

## 2017-07-13 NOTE — Discharge Summary (Signed)
Physician Discharge Summary  Patient ID: Alex Malone MRN: [id number] DOB/AGE: Mar 19, 1934 84 y.o.  Admit date: 07/11/2017 Discharge date: 07/13/2017   Discharge Diagnoses:  Active Problems:   Recurrent ventral hernia   Procedures: Repair of recurrent ventral hernia with mesh  Hospital Course: This a patient with recurrent ventral hernia he has had multiple operations in the past with mesh in place.  He was in the emergency room at one point showing an incarceration which was reduced in the ED and he was seen electively for planning of surgery for repair of this recurrent ventral hernia.  He was taken the operating room where repair was performed with mesh.  Mesh was present and densely adherent to colon and not removed but dissected off of the abdominal wall in order to place a new piece of mesh between the old piece and the abdominal wall adequately covering the hernia.  Postoperatively he did well is tolerating regular diet having no fevers or chills and is passing gas.  He has no problems at this time and will restart his home medications.  He is given oral analgesics for pain in the form of Percocet as needed.  He will follow-up in my office in 10 days for staple removal.  He may shower today and place a dry dressing if needed.  Consults: None  Disposition: Discharge disposition: 01-Home or Self Care        Allergies as of 07/13/2017   No Known Allergies     Medication List    TAKE these medications   acetaminophen 325 MG tablet Commonly known as:  TYLENOL Take 650 mg by mouth every 6 (six) hours as needed.   brimonidine 0.2 % ophthalmic solution Commonly known as:  ALPHAGAN Place 1 drop into both eyes 2 (two) times daily.   diazepam 2 MG tablet Commonly known as:  VALIUM Take 2 mg by mouth every 6 (six) hours as needed for anxiety.   dorzolamide-timolol 22.3-6.8 MG/ML ophthalmic solution Commonly known as:  COSOPT 1 drop 2 (two) times daily.   latanoprost 0.005  % ophthalmic solution Commonly known as:  XALATAN 1 drop at bedtime.   lisinopril 10 MG tablet Commonly known as:  PRINIVIL,ZESTRIL Take 10 mg by mouth daily.   Meclizine HCl 25 MG Chew Chew 1 tablet by mouth daily as needed.   ondansetron 4 MG disintegrating tablet Commonly known as:  ZOFRAN ODT Take 1 tablet (4 mg total) by mouth every 8 (eight) hours as needed for nausea or vomiting.   oxyCODONE-acetaminophen 5-325 MG tablet Commonly known as:  PERCOCET Take 1 tablet by mouth every 6 (six) hours as needed for moderate pain or severe pain.   senna 8.6 MG tablet Commonly known as:  SENOKOT Take 1 tablet by mouth daily.   simvastatin 20 MG tablet Commonly known as:  ZOCOR Take 20 mg by mouth at bedtime.   traMADol 50 MG tablet Commonly known as:  ULTRAM Take 1 tablet (50 mg total) by mouth every 8 (eight) hours as needed.      Follow-up Information    Florene Glen, MD Follow up in 10 day(s).   Specialty:  Surgery Contact information: [address] [place] [address] [phone]           Florene Glen, MD, FACS

## 2017-07-13 NOTE — Discharge Instructions (Signed)
May shower Resume all home medications Follow up Dr Burt Knack in ten days Regular diet

## 2017-07-13 NOTE — Progress Notes (Signed)
IV was removed. Discharge instructions, follow-up appointments, and prescriptions were provided to the pt. All questions answered. The pt was taken downstairs via wheelchair by volunteers.  

## 2017-07-24 ENCOUNTER — Encounter: Payer: Medicare Other | Admitting: Surgery

## 2017-07-25 ENCOUNTER — Ambulatory Visit (INDEPENDENT_AMBULATORY_CARE_PROVIDER_SITE_OTHER): Payer: Medicare Other | Admitting: Surgery

## 2017-07-25 ENCOUNTER — Encounter: Payer: Self-pay | Admitting: Surgery

## 2017-07-25 VITALS — BP 175/98 | HR 82 | Temp 97.6°F | Ht 74.0 in | Wt 196.6 lb

## 2017-07-25 DIAGNOSIS — K439 Ventral hernia without obstruction or gangrene: Secondary | ICD-10-CM

## 2017-07-25 NOTE — Patient Instructions (Signed)
Please see your follow up appointment listed below.  °

## 2017-07-25 NOTE — Progress Notes (Signed)
Outpatient postop visit  07/25/2017  Alex Malone is an 84 y.o. male.    Procedure: Repair of recurrent ventral hernia with mesh  CC: No problems  HPI: Patient states that he has no pain is not taking any narcotics feels well and wants to drive.  He has no problems with his wound is eating well having normal bowel movements.  Medications reviewed.    Physical Exam:  BP (!) 175/98   Pulse 82   Temp 97.6 F (36.4 C) (Oral)   Ht 6\' 2"  (1.88 m)   Wt 196 lb 9.6 oz (89.2 kg)   BMI 25.24 kg/m     PE: Wound is clean no erythema no drainage staples removed Steri-Strips are placed with Mastisol.    Assessment/Plan:  Patient doing very well following repair of a recurrent ventral hernia with mesh.  There are no signs of wound complications.  I would like to see him back next week for a wound check.  Florene Glen, MD, FACS

## 2017-08-02 ENCOUNTER — Encounter: Payer: Medicare Other | Admitting: Surgery

## 2017-08-03 ENCOUNTER — Encounter: Payer: Medicare Other | Admitting: Surgery

## 2017-08-04 ENCOUNTER — Ambulatory Visit (INDEPENDENT_AMBULATORY_CARE_PROVIDER_SITE_OTHER): Payer: Medicare Other | Admitting: Surgery

## 2017-08-04 ENCOUNTER — Encounter: Payer: Self-pay | Admitting: Surgery

## 2017-08-04 VITALS — BP 134/85 | HR 66 | Temp 97.8°F | Wt 201.0 lb

## 2017-08-04 DIAGNOSIS — K439 Ventral hernia without obstruction or gangrene: Secondary | ICD-10-CM

## 2017-08-04 NOTE — Progress Notes (Signed)
Outpatient postop visit  08/04/2017  Alex Malone is an 84 y.o. male.    Procedure: Repair of recurrent ventral hernia with mesh  CC: Wound check  HPI: This a patient who underwent the repair of a recurrent ventral hernia with mesh he is here for wound check.  His wound is completely healed and he feels well.  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    Physical Exam:  There were no vitals taken for this visit.    PE: Abdominal wound  Wound is completely healed no erythema no drainage soft nontender abdomen  Assessment/Plan:  Patient status post repair of recurrent ventral hernia with mesh.  Here for wound check.  Patient doing very well reminded of no heavy lifting for a total 6 weeks from the time of surgery.  He will follow-up on an as-needed basis.  For the 6 weeks he is released with unrestricted activity.  Florene Glen, MD, FACS

## 2017-12-25 DIAGNOSIS — H402233 Chronic angle-closure glaucoma, bilateral, severe stage: Secondary | ICD-10-CM | POA: Insufficient documentation

## 2017-12-25 DIAGNOSIS — H2513 Age-related nuclear cataract, bilateral: Secondary | ICD-10-CM | POA: Insufficient documentation

## 2018-01-26 DIAGNOSIS — R7302 Impaired glucose tolerance (oral): Secondary | ICD-10-CM | POA: Insufficient documentation

## 2019-07-12 DIAGNOSIS — H401133 Primary open-angle glaucoma, bilateral, severe stage: Secondary | ICD-10-CM | POA: Diagnosis not present

## 2019-07-30 DIAGNOSIS — R7302 Impaired glucose tolerance (oral): Secondary | ICD-10-CM | POA: Diagnosis not present

## 2019-07-30 DIAGNOSIS — I10 Essential (primary) hypertension: Secondary | ICD-10-CM | POA: Diagnosis not present

## 2019-08-06 DIAGNOSIS — E782 Mixed hyperlipidemia: Secondary | ICD-10-CM | POA: Diagnosis not present

## 2019-08-06 DIAGNOSIS — H409 Unspecified glaucoma: Secondary | ICD-10-CM | POA: Diagnosis not present

## 2019-08-06 DIAGNOSIS — F419 Anxiety disorder, unspecified: Secondary | ICD-10-CM | POA: Diagnosis not present

## 2019-08-06 DIAGNOSIS — R739 Hyperglycemia, unspecified: Secondary | ICD-10-CM | POA: Diagnosis not present

## 2019-08-06 DIAGNOSIS — I10 Essential (primary) hypertension: Secondary | ICD-10-CM | POA: Diagnosis not present

## 2019-10-12 IMAGING — CT CT ABD-PELV W/ CM
2 of 5 series · 15 of 46 positions shown, 17 images · IV contrast (iopamidol)
Comparison: 01/21/2013 CT.

CLINICAL DATA: 83-year-old male with mid abdominal pain for several
weeks. Prior cholecystectomy and four hernia repairs. Prostate
cancer. Post prostatectomy. Initial encounter.

EXAM:
CT ABDOMEN AND PELVIS WITH CONTRAST
TECHNIQUE: Multidetector CT imaging of the abdomen and pelvis was performed
using the standard protocol following bolus administration of
intravenous contrast.
CONTRAST:  100mL [id number] IOPAMIDOL (IPDI2P-6VV) INJECTION 61%

[Series 2: axial soft tissue · axial · 0.98mm/px · z∈[-558,-103]mm · 12 of 103 slices shown, 14 images]
[im 6/103  soft-tissue]
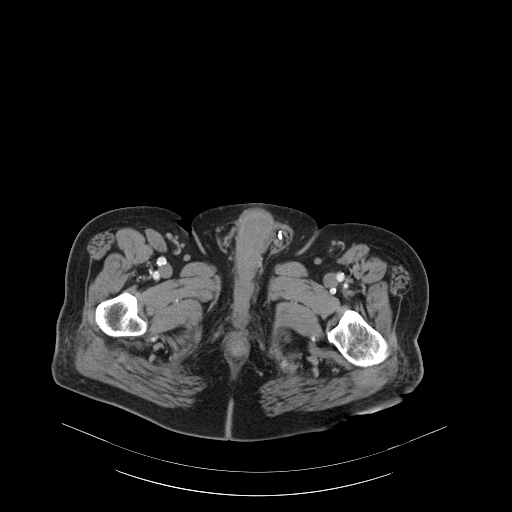
[im 6/103  bone]
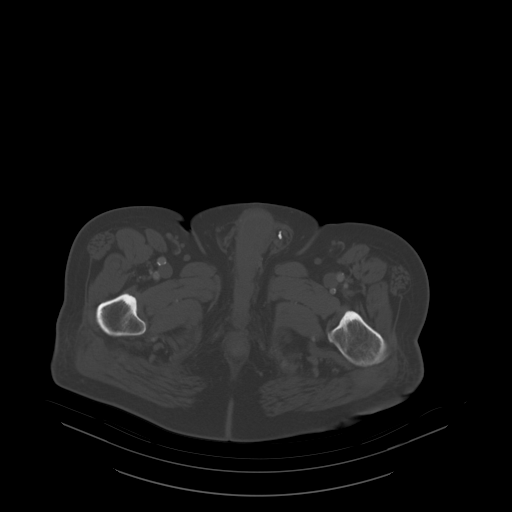
[im 17/103  soft-tissue]
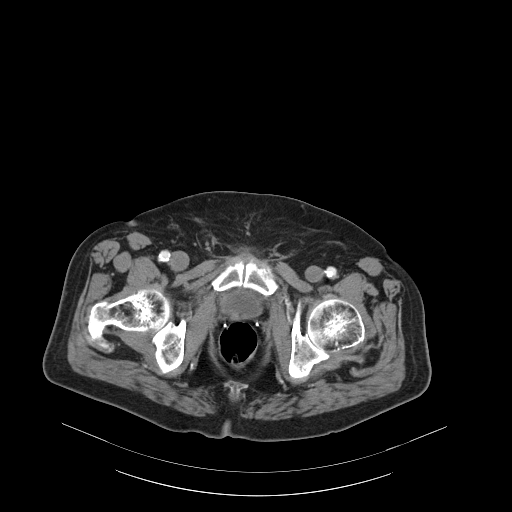
[im 22/103  soft-tissue]
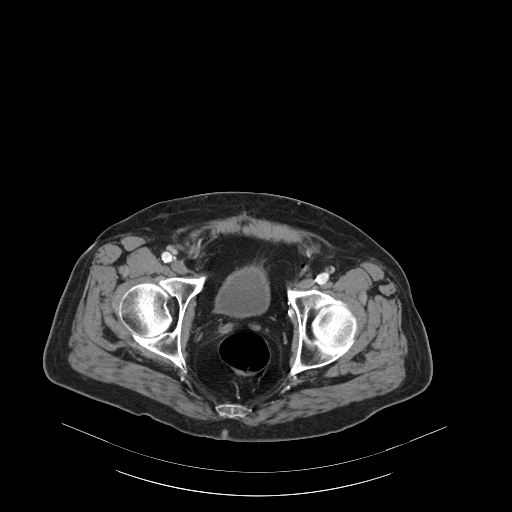
[im 33/103  soft-tissue]
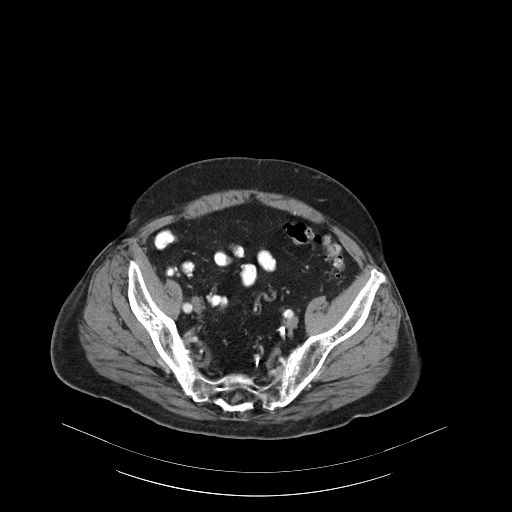
[im 38/103  soft-tissue]
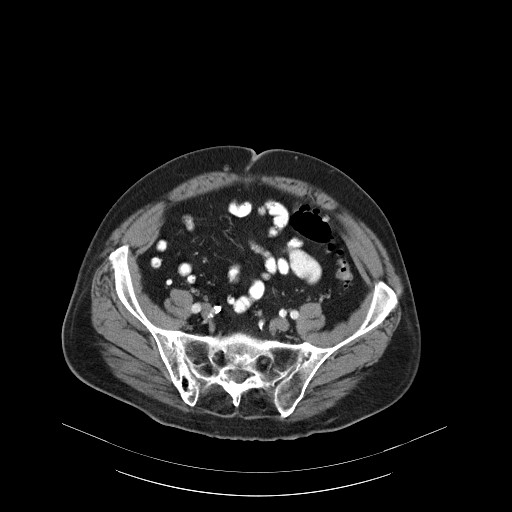
[im 49/103  soft-tissue]
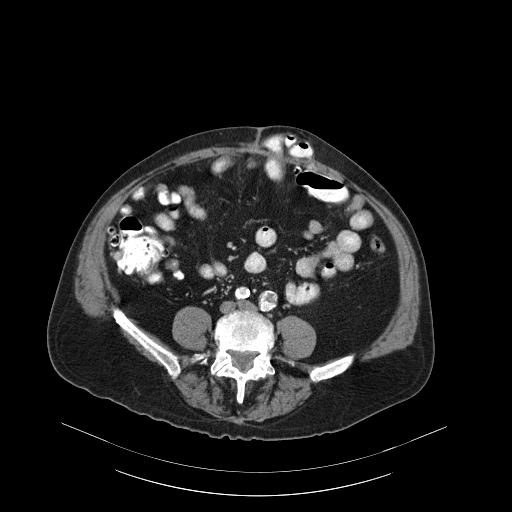
[im 54/103  soft-tissue]
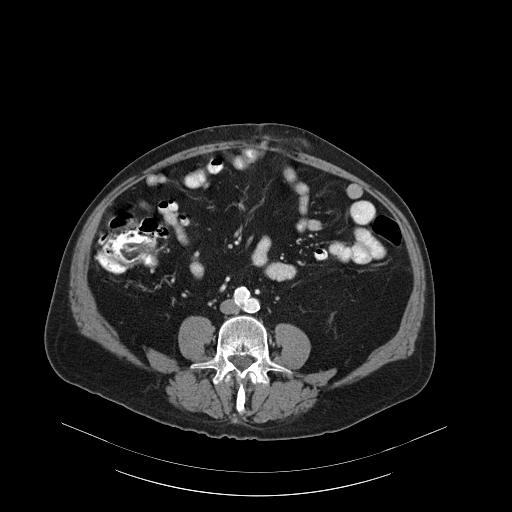
[im 65/103  soft-tissue]
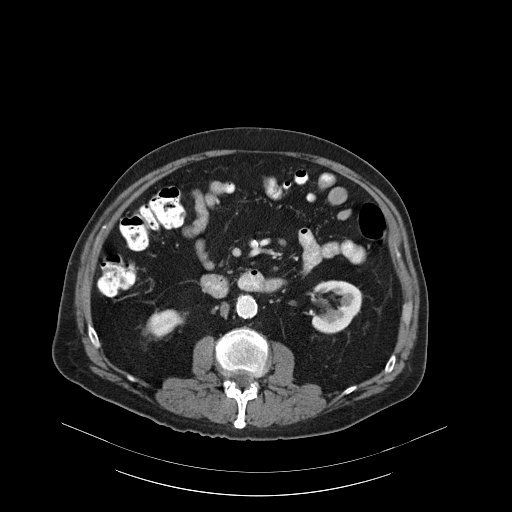
[im 70/103  soft-tissue]
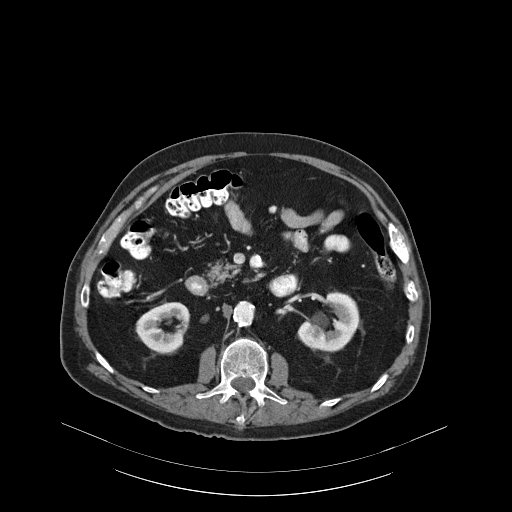
[im 70/103  bone]
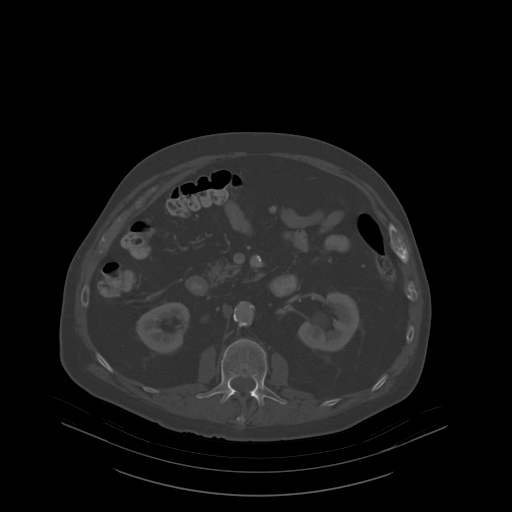
[im 81/103  soft-tissue]
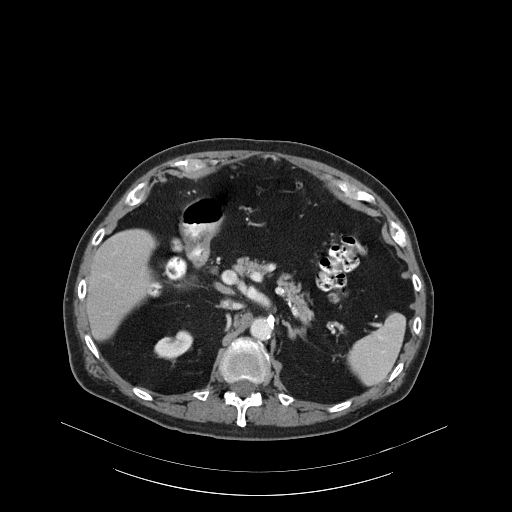
[im 86/103  soft-tissue]
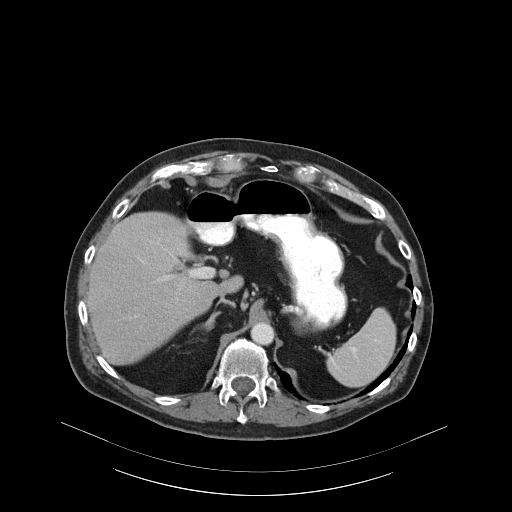
[im 97/103  soft-tissue]
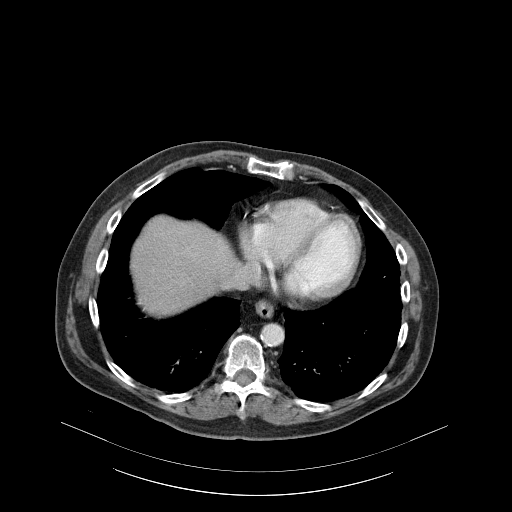

[Series 602: coronal · coronal · 0.99mm/px · 3 of 135 slices shown]
[im 45/135  soft-tissue]
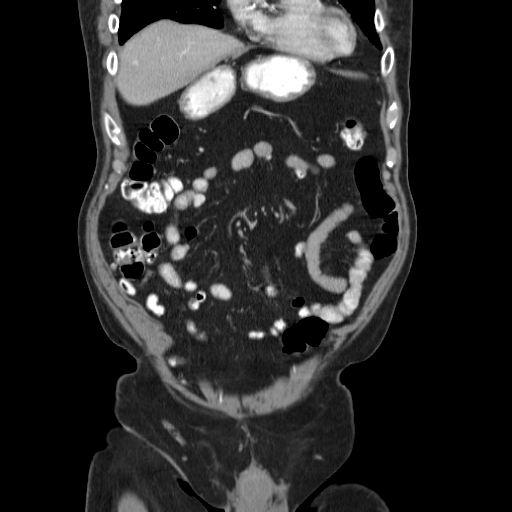
[im 60/135  soft-tissue]
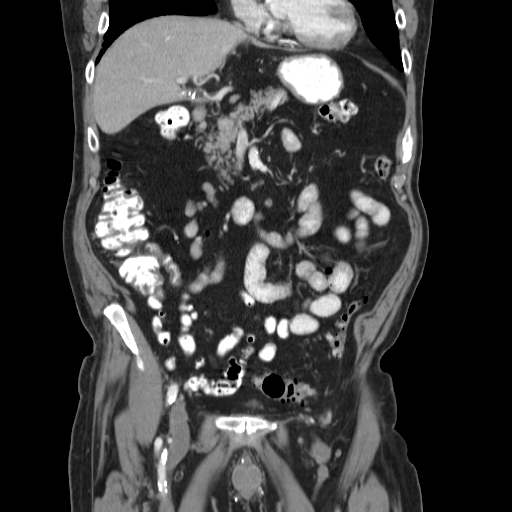
[im 75/135  soft-tissue]
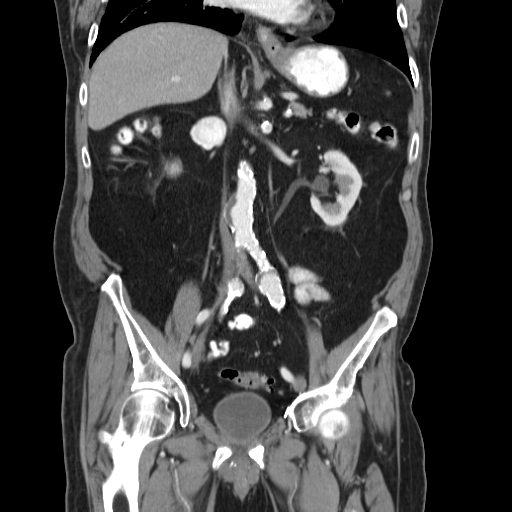

[15 of 46 positions shown; findings below may reference images not displayed]

FINDINGS: Lower chest: No worrisome lung base abnormality.

Heart size within normal limits. Coronary artery and aortic valve
calcifications.

Hepatobiliary: No worrisome hepatic lesion.  Post cholecystectomy.

Pancreas: No pancreatic mass or inflammation.

Spleen: No splenic mass or enlargement.

Adrenals/Urinary Tract: Scarring right kidney. Parapelvic left renal
cysts and left lower pole renal cyst. No obstructing stone or
hydronephrosis. No adrenal lesion.

Evaluation of urinary bladder limited by lack of contrast. Patient
has had a prior prostatectomy. 1 cm nodular opacity right
posterolateral bladder wall/ prostatectomy bed without significant
change from 9073 and may represent scar from prostatectomy.

Stomach/Bowel: Scattered diverticulosis. Findings most notable
sigmoid colon where there is significant associated muscular
hypertrophy but no evidence of extraluminal bowel inflammatory
process.

Left paracentral peri umbilical region hernia containing small bowel
loops. This does not cause obstruction.

Small hiatal hernia.

Vascular/Lymphatic: Scattered small lymph nodes without adenopathy.

Prominent complex plaque throughout the abdominal aorta which is
ectatic. Distal abdominal aorta aneurysm measures 3.1 x 2.8 cm
versus 2.8 x 2.6 cm on 9073 exam.

Left common iliac artery aneurysm with peripheral plaque measures
2.7 cm versus prior 2.4 cm.

Plaque with high-grade stenosis origin celiac artery, superior
mesenteric artery and inferior mesenteric artery.

Reproductive: As above.

Other: Phleboliths superior left testicular region incidentally
noted.

Musculoskeletal: Postsurgical changes right ilium. Fusion sacroiliac
joints. No sclerotic metastatic foci.

Moderate L5-S1 disc degeneration.  Mild bulge L1-2.
IMPRESSION: Scattered diverticulosis. Findings most notable sigmoid colon where
there is significant associated muscular hypertrophy however, no
evidence of extraluminal bowel inflammatory process.

Left paracentral peri umbilical hernia containing small bowel loops.
This does not cause obstruction.

Small hiatal hernia.

Prior prostatectomy. 1 cm nodular opacity right posterolateral
bladder wall/ prostatectomy bed without significant chain from 9073
and may represent scar from prostatectomy.

Aortic Atherosclerosis ([id number]-UE8.8).

Distal abdominal aortic aneurysm measures 3.1 x 2.8 cm versus 2.8 x
2.6 cm on 9073 exam. Recommend followup by ultrasound in 3 years.
This recommendation follows ACR consensus guidelines: White Paper of
the ACR Incidental Findings Committee II on Vascular Findings. [HOSPITAL] 6631; [DATE]

Left common iliac artery aneurysm measures 2.7 cm versus prior
cm.

Plaque with high-grade stenosis origin celiac artery, superior
mesenteric artery and inferior mesenteric artery.

Coronary artery calcifications.

## 2019-11-07 DIAGNOSIS — Q828 Other specified congenital malformations of skin: Secondary | ICD-10-CM | POA: Diagnosis not present

## 2019-11-07 DIAGNOSIS — B351 Tinea unguium: Secondary | ICD-10-CM | POA: Diagnosis not present

## 2019-11-07 DIAGNOSIS — L909 Atrophic disorder of skin, unspecified: Secondary | ICD-10-CM | POA: Diagnosis not present

## 2019-11-07 DIAGNOSIS — M79672 Pain in left foot: Secondary | ICD-10-CM | POA: Diagnosis not present

## 2019-11-07 DIAGNOSIS — L851 Acquired keratosis [keratoderma] palmaris et plantaris: Secondary | ICD-10-CM | POA: Diagnosis not present

## 2019-11-07 DIAGNOSIS — M79671 Pain in right foot: Secondary | ICD-10-CM | POA: Diagnosis not present

## 2019-11-07 DIAGNOSIS — D2371 Other benign neoplasm of skin of right lower limb, including hip: Secondary | ICD-10-CM | POA: Diagnosis not present

## 2019-11-28 DIAGNOSIS — L851 Acquired keratosis [keratoderma] palmaris et plantaris: Secondary | ICD-10-CM | POA: Diagnosis not present

## 2019-11-28 DIAGNOSIS — B351 Tinea unguium: Secondary | ICD-10-CM | POA: Diagnosis not present

## 2019-11-28 DIAGNOSIS — L909 Atrophic disorder of skin, unspecified: Secondary | ICD-10-CM | POA: Diagnosis not present

## 2019-12-06 DIAGNOSIS — H2512 Age-related nuclear cataract, left eye: Secondary | ICD-10-CM | POA: Diagnosis not present

## 2020-01-31 DIAGNOSIS — I10 Essential (primary) hypertension: Secondary | ICD-10-CM | POA: Diagnosis not present

## 2020-01-31 DIAGNOSIS — E782 Mixed hyperlipidemia: Secondary | ICD-10-CM | POA: Diagnosis not present

## 2020-01-31 DIAGNOSIS — R739 Hyperglycemia, unspecified: Secondary | ICD-10-CM | POA: Diagnosis not present

## 2020-02-07 DIAGNOSIS — Z23 Encounter for immunization: Secondary | ICD-10-CM | POA: Diagnosis not present

## 2020-02-07 DIAGNOSIS — Z0000 Encounter for general adult medical examination without abnormal findings: Secondary | ICD-10-CM | POA: Diagnosis not present

## 2020-02-07 DIAGNOSIS — F419 Anxiety disorder, unspecified: Secondary | ICD-10-CM | POA: Diagnosis not present

## 2020-02-07 DIAGNOSIS — R7302 Impaired glucose tolerance (oral): Secondary | ICD-10-CM | POA: Diagnosis not present

## 2020-02-07 DIAGNOSIS — Z0001 Encounter for general adult medical examination with abnormal findings: Secondary | ICD-10-CM | POA: Diagnosis not present

## 2020-02-07 DIAGNOSIS — I10 Essential (primary) hypertension: Secondary | ICD-10-CM | POA: Diagnosis not present

## 2020-02-15 IMAGING — CT CT ABD-PELV W/ CM
2 of 5 series · 15 of 46 positions shown, 17 images · IV contrast (APPLIED)
Comparison: CT scan 02/21/2017

CLINICAL DATA: Painful anterior abdominal wall hernia with nausea
and vomiting.

EXAM:
CT ABDOMEN AND PELVIS WITH CONTRAST
TECHNIQUE: Multidetector CT imaging of the abdomen and pelvis was performed
using the standard protocol following bolus administration of
intravenous contrast.
CONTRAST:  100mL [id number] IOPAMIDOL (XZBW1N-1XX) INJECTION 61%

[Series 2: routine abd/pel with · axial · 0.84mm/px · z∈[-1052,-587]mm · 12 of 105 slices shown, 14 images]
[im 6/105  soft-tissue]
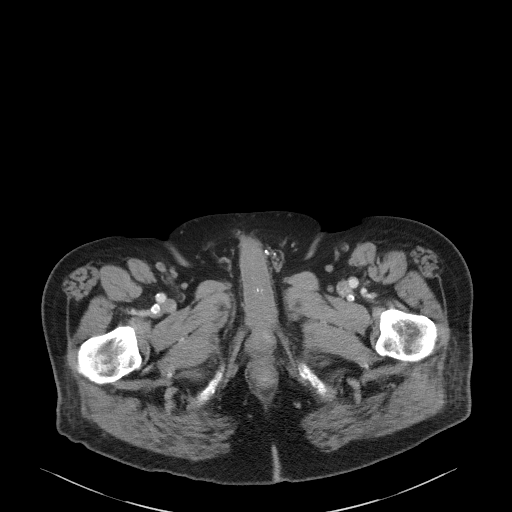
[im 6/105  bone]
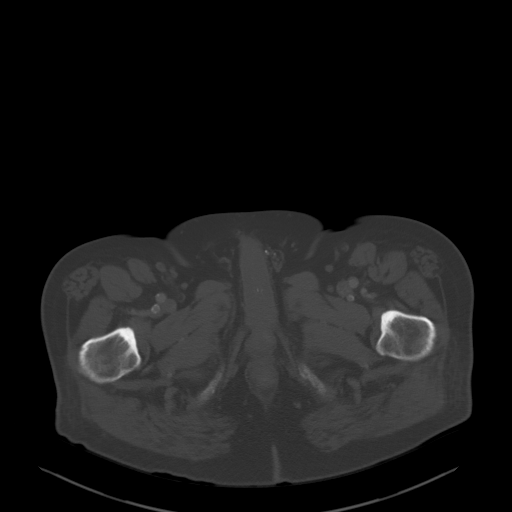
[im 17/105  soft-tissue]
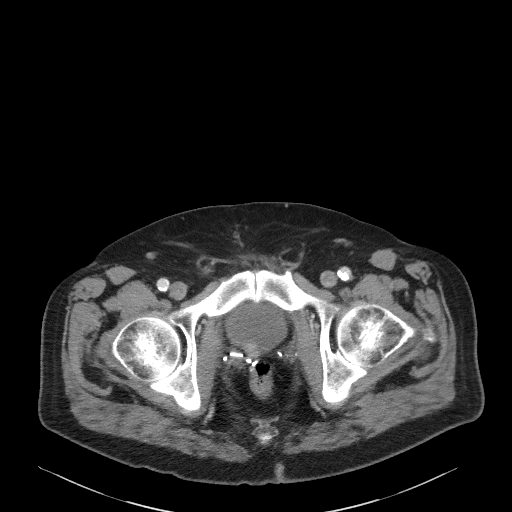
[im 22/105  soft-tissue]
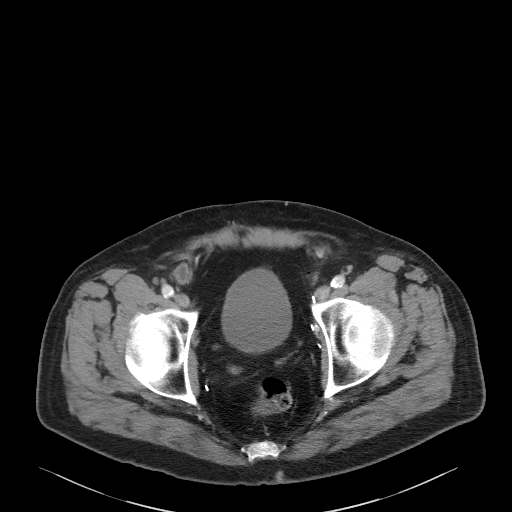
[im 33/105  soft-tissue]
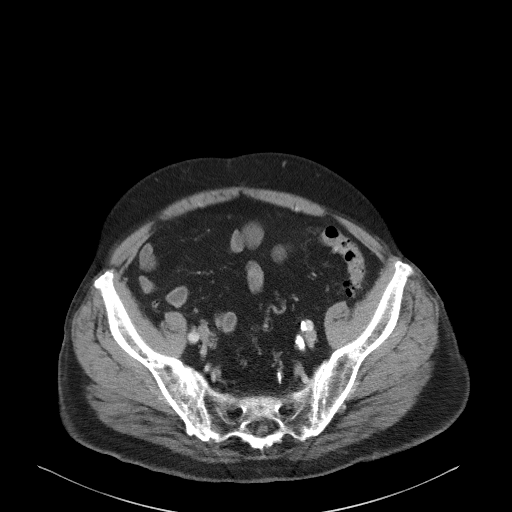
[im 39/105  soft-tissue]
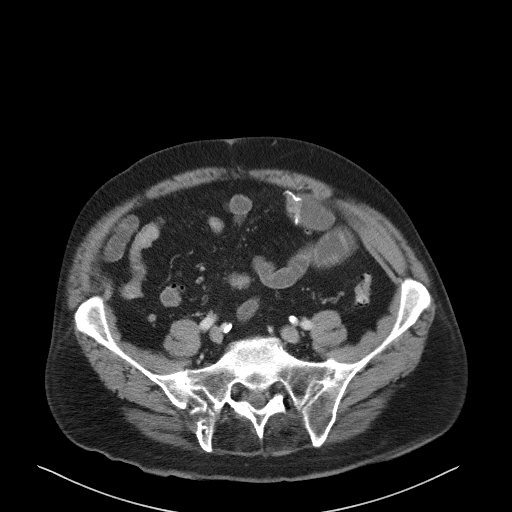
[im 50/105  soft-tissue]
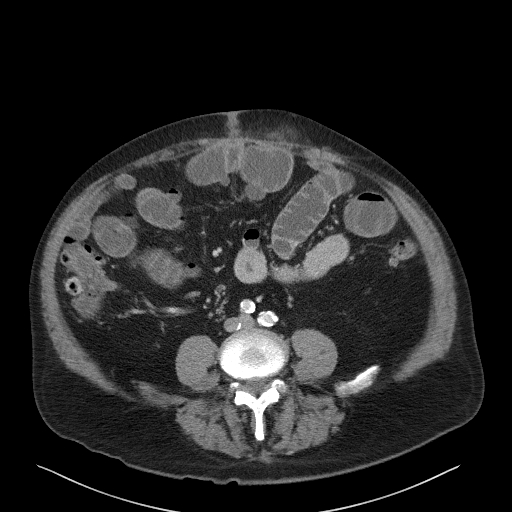
[im 55/105  soft-tissue]
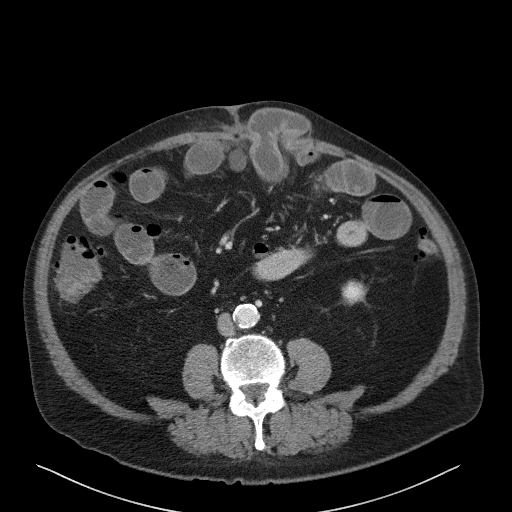
[im 66/105  soft-tissue]
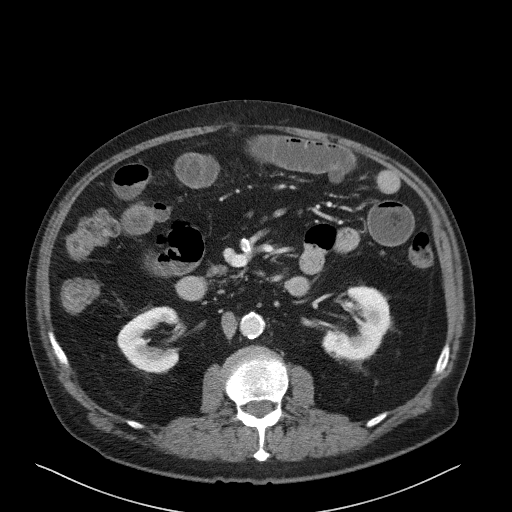
[im 72/105  soft-tissue]
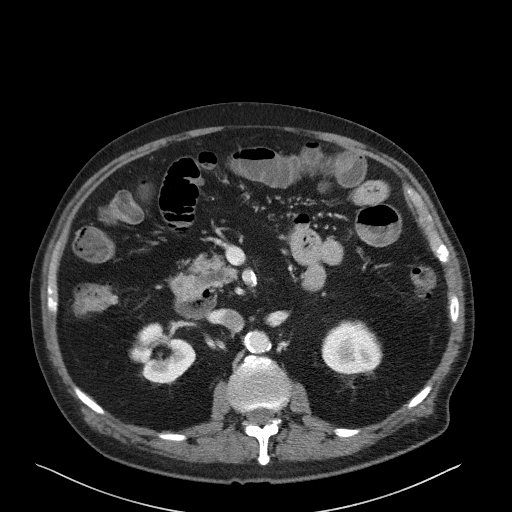
[im 72/105  bone]
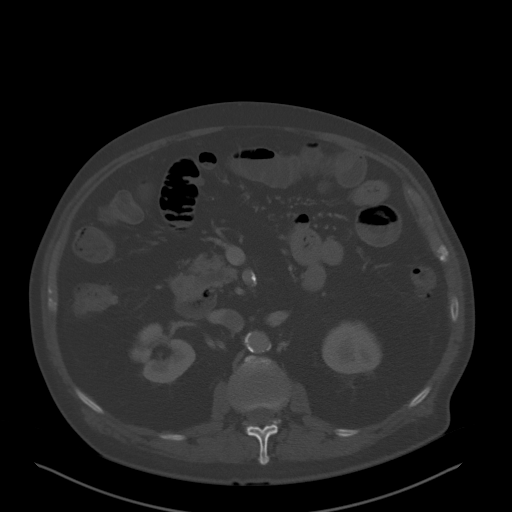
[im 83/105  soft-tissue]
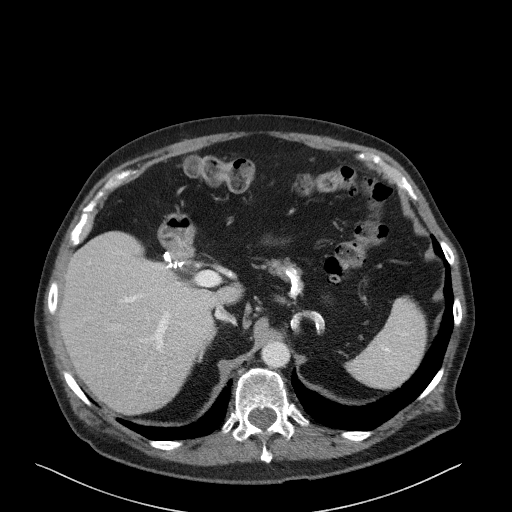
[im 88/105  soft-tissue]
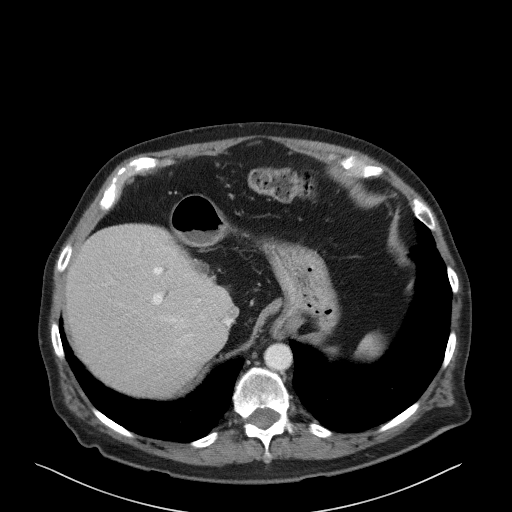
[im 99/105  soft-tissue]
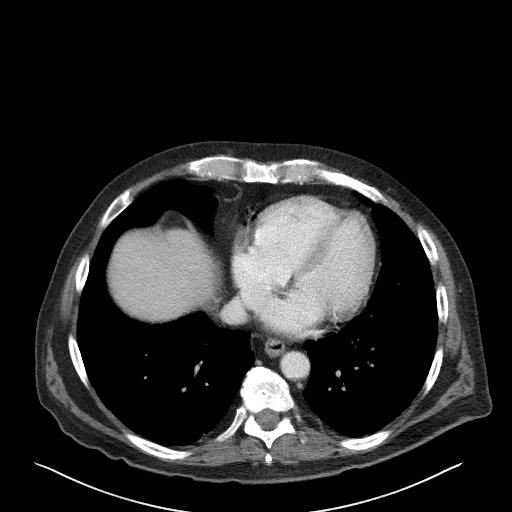

[Series 5: coronal st · coronal · 0.78mm/px · 3 of 107 slices shown]
[im 36/107  soft-tissue]
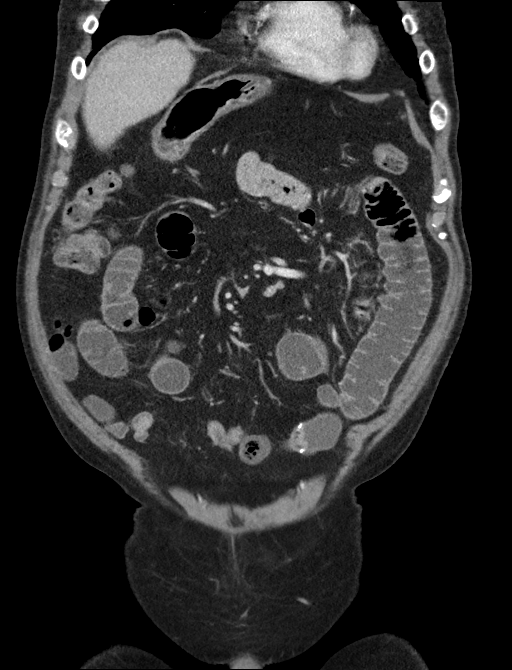
[im 48/107  soft-tissue]
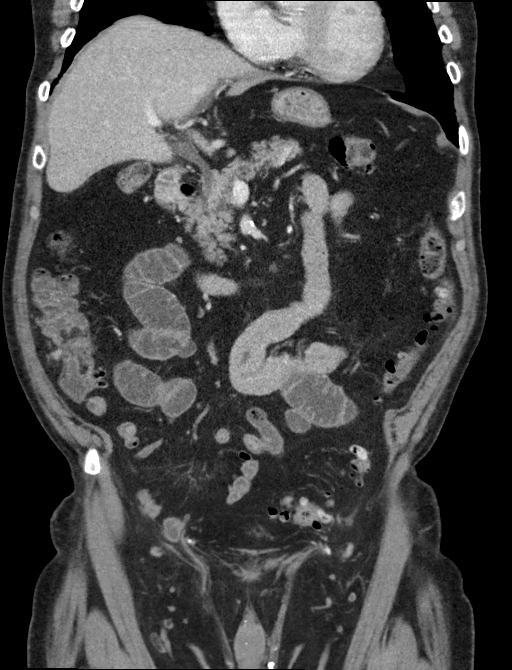
[im 59/107  soft-tissue]
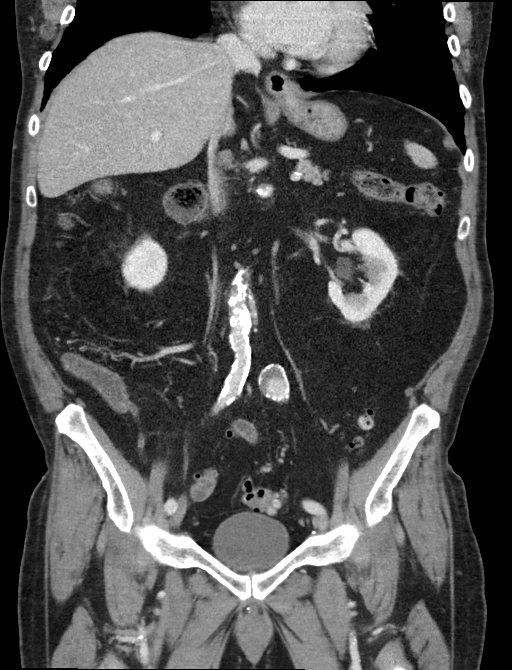

[15 of 46 positions shown; findings below may reference images not displayed]

FINDINGS: Lower chest: The lung bases are clear of an acute process. Stable
peripheral interstitial lung disease. No worrisome pulmonary
lesions. The heart is normal in size. No pericardial effusion. Dense
three-vessel coronary artery calcifications are noted. There is a
small hiatal hernia.

Hepatobiliary: No focal hepatic lesions or intrahepatic biliary
dilatation. The gallbladder is surgically absent. Stable mild
associated biliary dilatation.

Pancreas: No mass, inflammation or ductal dilatation. Moderate
pancreatic atrophy.

Spleen: Normal size.  No focal lesions.

Adrenals/Urinary Tract: The adrenal glands and kidneys are
unremarkable. Stable renal cysts and renal scarring changes but no
worrisome lesions or hydronephrosis.

Stomach/Bowel: The stomach is unremarkable. The duodenum is normal.
Mid small bowel loops are dilated with air-fluid levels consistent
with obstruction. This is due to a left-sided abdominal wall hernia.
The bowel is dilated down to the hernia and decompressed post
hernia. No bowel ischemia or pneumatosis.

The terminal ileum and appendix are normal.

Diffuse colonic diverticulosis without findings for acute
diverticulitis. Colon is relatively decompressed.

Vascular/Lymphatic: Advanced atherosclerotic calcifications
involving the aorta and branch vessels. Stable left iliac artery
aneurysm measuring 23 mm.

No adenopathy.

Reproductive: Status post prostatectomy.

Other: No pelvic mass or adenopathy. No free pelvic fluid
collections. No inguinal mass or adenopathy. No abdominal wall
hernia or subcutaneous lesions.

Musculoskeletal: No significant bony findings. Surgical changes
involving the right iliac bone.
IMPRESSION: 1. Small-bowel obstruction due to an anterior abdominal wall hernia.
2. Diffuse colonic diverticulosis without findings for acute
diverticulitis.
3. Status post cholecystectomy.  No biliary dilatation.
4. Small hiatal hernia.
5. Severe atherosclerotic changes involving the aorta and branch
vessels.
6. Status post prostatectomy.

## 2020-03-19 DIAGNOSIS — H401133 Primary open-angle glaucoma, bilateral, severe stage: Secondary | ICD-10-CM | POA: Diagnosis not present

## 2020-04-21 DIAGNOSIS — H401123 Primary open-angle glaucoma, left eye, severe stage: Secondary | ICD-10-CM | POA: Diagnosis not present

## 2020-05-20 DIAGNOSIS — H401123 Primary open-angle glaucoma, left eye, severe stage: Secondary | ICD-10-CM | POA: Diagnosis not present

## 2020-06-11 DIAGNOSIS — H2512 Age-related nuclear cataract, left eye: Secondary | ICD-10-CM | POA: Diagnosis not present

## 2020-06-17 ENCOUNTER — Other Ambulatory Visit: Payer: Self-pay

## 2020-06-17 ENCOUNTER — Encounter: Payer: Self-pay | Admitting: Ophthalmology

## 2020-06-18 ENCOUNTER — Other Ambulatory Visit
Admission: RE | Admit: 2020-06-18 | Discharge: 2020-06-18 | Disposition: A | Discharge status: Home / Self Care | Payer: Medicare HMO | Source: Ambulatory Visit | Attending: Ophthalmology | Admitting: Ophthalmology

## 2020-06-18 DIAGNOSIS — Z01812 Encounter for preprocedural laboratory examination: Secondary | ICD-10-CM | POA: Insufficient documentation

## 2020-06-18 DIAGNOSIS — Z20822 Contact with and (suspected) exposure to covid-19: Secondary | ICD-10-CM | POA: Diagnosis not present

## 2020-06-18 LAB — SARS CORONAVIRUS 2 (TAT 6-24 HRS): SARS Coronavirus 2: NEGATIVE

## 2020-06-18 NOTE — Discharge Instructions (Signed)

## 2020-06-22 ENCOUNTER — Encounter: Payer: Self-pay | Admitting: Ophthalmology

## 2020-06-22 ENCOUNTER — Ambulatory Visit: Payer: Medicare HMO | Admitting: Anesthesiology

## 2020-06-22 ENCOUNTER — Encounter
Admission: RE | Disposition: A | Discharge status: Home / Self Care | Payer: Self-pay | Source: Home / Self Care | Attending: Ophthalmology

## 2020-06-22 ENCOUNTER — Ambulatory Visit
Admission: RE | Admit: 2020-06-22 | Discharge: 2020-06-22 | Disposition: A | Discharge status: Home / Self Care | Payer: Medicare HMO | Attending: Ophthalmology | Admitting: Ophthalmology

## 2020-06-22 ENCOUNTER — Other Ambulatory Visit: Payer: Self-pay

## 2020-06-22 DIAGNOSIS — Z7982 Long term (current) use of aspirin: Secondary | ICD-10-CM | POA: Insufficient documentation

## 2020-06-22 DIAGNOSIS — H401123 Primary open-angle glaucoma, left eye, severe stage: Secondary | ICD-10-CM | POA: Insufficient documentation

## 2020-06-22 DIAGNOSIS — Z79899 Other long term (current) drug therapy: Secondary | ICD-10-CM | POA: Diagnosis not present

## 2020-06-22 DIAGNOSIS — H2512 Age-related nuclear cataract, left eye: Secondary | ICD-10-CM | POA: Insufficient documentation

## 2020-06-22 DIAGNOSIS — H25812 Combined forms of age-related cataract, left eye: Secondary | ICD-10-CM | POA: Diagnosis not present

## 2020-06-22 HISTORY — DX: Unspecified hearing loss, unspecified ear: H91.90

## 2020-06-22 HISTORY — PX: CATARACT EXTRACTION W/PHACO: SHX586

## 2020-06-22 SURGERY — PHACOEMULSIFICATION, CATARACT, WITH IOL INSERTION
Anesthesia: Monitor Anesthesia Care | Site: Eye | Laterality: Left

## 2020-06-22 MED ORDER — ACETAMINOPHEN 325 MG PO TABS
325.0000 mg | ORAL_TABLET | ORAL | Status: DC | PRN
Start: 2020-06-22 — End: 2020-06-22

## 2020-06-22 MED ORDER — ACETAMINOPHEN 160 MG/5ML PO SOLN: 325.0000 mg | ORAL | Status: DC | PRN

## 2020-06-22 MED ORDER — MIDAZOLAM HCL 2 MG/2ML IJ SOLN
INTRAMUSCULAR | Status: DC | PRN
  Administered 2020-06-22: 1 mg via INTRAVENOUS

## 2020-06-22 MED ORDER — LIDOCAINE HCL (PF) 2 % IJ SOLN
INTRAMUSCULAR | Status: DC | PRN
  Administered 2020-06-22: 2 mL

## 2020-06-22 MED ORDER — LACTATED RINGERS IV SOLN: INTRAVENOUS | Status: DC

## 2020-06-22 MED ORDER — ONDANSETRON HCL 4 MG/2ML IJ SOLN: 4.0000 mg | Freq: Once | INTRAMUSCULAR | Status: DC | PRN

## 2020-06-22 MED ORDER — BUPIVACAINE HCL (PF) 0.75 % IJ SOLN
INTRAMUSCULAR | Status: DC | PRN
  Administered 2020-06-22: 2 mL

## 2020-06-22 MED ORDER — HYALURONIDASE HUMAN 150 UNIT/ML IJ SOLN
INTRAMUSCULAR | Status: DC | PRN
  Administered 2020-06-22: 100 [IU]

## 2020-06-22 MED ORDER — SODIUM HYALURONATE 23 MG/ML IO SOLN
INTRAOCULAR | Status: DC | PRN
  Administered 2020-06-22: 0.6 mL via INTRAOCULAR

## 2020-06-22 MED ORDER — TRIAMCINOLONE ACETONIDE 40 MG/ML IJ SUSP
INTRAMUSCULAR | Status: DC | PRN
  Administered 2020-06-22: .1 mL

## 2020-06-22 MED ORDER — EPINEPHRINE PF 1 MG/ML IJ SOLN
INTRAOCULAR | Status: DC | PRN
  Administered 2020-06-22: 61 mL via OPHTHALMIC

## 2020-06-22 MED ORDER — SODIUM HYALURONATE 10 MG/ML IO SOLN
INTRAOCULAR | Status: DC | PRN
  Administered 2020-06-22: 0.55 mL via INTRAOCULAR

## 2020-06-22 MED ORDER — ALFENTANIL 500 MCG/ML IJ INJ
INJECTION | INTRAVENOUS | Status: DC | PRN
  Administered 2020-06-22: 100 ug via INTRAVENOUS
  Administered 2020-06-22: 200 ug via INTRAVENOUS
  Administered 2020-06-22: 500 ug via INTRAVENOUS

## 2020-06-22 MED ORDER — TETRACAINE HCL 0.5 % OP SOLN
1.0000 [drp] | OPHTHALMIC | Status: DC | PRN
  Administered 2020-06-22 (×3): 1 [drp] via OPHTHALMIC

## 2020-06-22 MED ORDER — MOXIFLOXACIN HCL 0.5 % OP SOLN
OPHTHALMIC | Status: DC | PRN
  Administered 2020-06-22: 0.2 mL via OPHTHALMIC

## 2020-06-22 MED ORDER — NEOMYCIN-POLYMYXIN-DEXAMETH 3.5-10000-0.1 OP OINT
TOPICAL_OINTMENT | OPHTHALMIC | Status: DC | PRN
  Administered 2020-06-22: 1 via OPHTHALMIC

## 2020-06-22 MED ORDER — ARMC OPHTHALMIC DILATING DROPS
1.0000 "application " | OPHTHALMIC | Status: DC | PRN
  Administered 2020-06-22 (×3): 1 via OPHTHALMIC

## 2020-06-22 MED ORDER — LIDOCAINE HCL (PF) 2 % IJ SOLN
INTRAOCULAR | Status: DC | PRN
  Administered 2020-06-22: 4 mL via INTRAOCULAR

## 2020-06-22 SURGICAL SUPPLY — 27 items
ALLOGRAFT TUTOPLST SCER0.5X1.0 (Tissue) ×1 IMPLANT
CANNULA ANT/CHMB 27GA (MISCELLANEOUS) ×4 IMPLANT
CORD BIP STRL DISP 12FT (MISCELLANEOUS) ×2 IMPLANT
DISSECTOR HYDRO NUCLEUS 50X22 (MISCELLANEOUS) ×2 IMPLANT
ERASER TAPRD BLUNT STR 20-23GA (MISCELLANEOUS) ×1 IMPLANT
GLOVE SURG SYN 8.5  E (GLOVE) ×2
GLOVE SURG SYN 8.5 E (GLOVE) ×2 IMPLANT
GOWN STRL REUS W/ TWL LRG LVL3 (GOWN DISPOSABLE) ×2 IMPLANT
GOWN STRL REUS W/TWL LRG LVL3 (GOWN DISPOSABLE) ×4
LENS IOL TECNIS EYHANCE 20.5 (Intraocular Lens) ×2 IMPLANT
MARKER SKIN DUAL TIP RULER LAB (MISCELLANEOUS) ×2 IMPLANT
NDL RETROBULBAR 25GX1.5 STRL (NEEDLE) ×2 IMPLANT
PACK DR. KING ARMS (PACKS) ×2 IMPLANT
PACK EYE AFTER SURG (MISCELLANEOUS) ×2 IMPLANT
PACK OPTHALMIC (MISCELLANEOUS) ×2 IMPLANT
SUT ETHILON 10-0 CS-B-6CS-B-6 (SUTURE) ×2
SUT ETHILON 9-0 (SUTURE) ×2 IMPLANT
SUT SILK 5-0 (SUTURE) ×2 IMPLANT
SUT VICRYL 7 0 TG140 8 (SUTURE) ×2 IMPLANT
SUTURE EHLN 10-0 CS-B-6CS-B-6 (SUTURE) ×1 IMPLANT
SYR 3ML LL SCALE MARK (SYRINGE) ×2 IMPLANT
SYR TB 1ML LUER SLIP (SYRINGE) ×2 IMPLANT
TAPERED BLUNT TIP STR 20-23GA (MISCELLANEOUS) ×2
TUTOPLAST SCIERA 0.5X1.0 (Tissue) ×2 IMPLANT
VALVE GLAUCOMA AHMED (Prosthesis & Implant Heart) ×2 IMPLANT
WATER STERILE IRR 250ML POUR (IV SOLUTION) ×2 IMPLANT
WIPE NON LINTING 3.25X3.25 (MISCELLANEOUS) ×2 IMPLANT

## 2020-06-22 NOTE — Anesthesia Postprocedure Evaluation (Signed)
Anesthesia Post Note  Patient: Alex Malone  Procedure(s) Performed: CATARACT EXTRACTION PHACO AND INTRAOCULAR LENS PLACEMENT (IOC) LEFT AHMED TUBE SHUNT W/TUTOPLAST 3.04 00:26.7 (Left Eye)     Patient location during evaluation: PACU Anesthesia Type: MAC Level of consciousness: awake and alert Pain management: pain level controlled Vital Signs Assessment: post-procedure vital signs reviewed and stable Respiratory status: spontaneous breathing, nonlabored ventilation, respiratory function stable and patient connected to nasal cannula oxygen Cardiovascular status: stable and blood pressure returned to baseline Postop Assessment: no apparent nausea or vomiting Anesthetic complications: no   No complications documented.  Sinda Du

## 2020-06-22 NOTE — Anesthesia Preprocedure Evaluation (Signed)
Anesthesia Evaluation  Patient identified by MRN, date of birth, ID band Patient awake    Reviewed: Allergy & Precautions, NPO status , Patient's Chart, lab work & pertinent test results  History of Anesthesia Complications Negative for: history of anesthetic complications  Airway Mallampati: II  TM Distance: >3 FB Neck ROM: Full    Dental no notable dental hx.    Pulmonary neg pulmonary ROS, Patient abstained from smoking.,    Pulmonary exam normal breath sounds clear to auscultation       Cardiovascular Exercise Tolerance: Good hypertension, negative cardio ROS Normal cardiovascular exam Rhythm:Regular Rate:Normal     Neuro/Psych PSYCHIATRIC DISORDERS Anxiety negative neurological ROS     GI/Hepatic negative GI ROS, Neg liver ROS,   Endo/Other  negative endocrine ROS  Renal/GU negative Renal ROS  negative genitourinary   Musculoskeletal negative musculoskeletal ROS (+)   Abdominal Normal abdominal exam  (+) - obese,  Abdomen: soft.    Peds negative pediatric ROS (+)  Hematology negative hematology ROS (+)   Anesthesia Other Findings   Reproductive/Obstetrics negative OB ROS                             Anesthesia Physical Anesthesia Plan  ASA: II  Anesthesia Plan: General and MAC   Post-op Pain Management:    Induction: Intravenous  PONV Risk Score and Plan: 1 and Treatment may vary due to age or medical condition  Airway Management Planned: Nasal Cannula and Natural Airway  Additional Equipment:   Intra-op Plan:   Post-operative Plan: Extubation in OR  Informed Consent: I have reviewed the patients History and Physical, chart, labs and discussed the procedure including the risks, benefits and alternatives for the proposed anesthesia with the patient or authorized representative who has indicated his/her understanding and acceptance.     Dental advisory  given  Plan Discussed with: CRNA  Anesthesia Plan Comments:         Anesthesia Quick Evaluation Patient Active Problem List   Diagnosis Date Noted  . Recurrent ventral hernia   . Small bowel obstruction ([place])   . Anxiety 02/20/2017  . Vertigo 02/20/2017  . Hyperuricemia 08/11/2016  . History of lacunar cerebrovascular accident (CVA) 01/07/2014  . History of prostate cancer 01/07/2014  . History of subdural hematoma 01/07/2014  . Hyperlipidemia, unspecified 11/25/2012  . Hypertension 11/25/2012    CBC Latest Ref Rng & Units 07/13/2017 07/11/2017 06/27/2017  WBC 3.8 - 10.6 K/uL 8.9 10.7(H) 9.0  Hemoglobin 13.0 - 18.0 g/dL 15.4 16.5 16.8  Hematocrit 40.0 - 52.0 % 45.4 48.1 50.5  Platelets 150 - 440 K/uL 176 195 218   BMP Latest Ref Rng & Units 07/11/2017 06/27/2017 02/14/2017  Glucose 65 - 99 mg/dL - 135(H) -  BUN 6 - 20 mg/dL - 18 16  Creatinine 0.61 - 1.24 mg/dL 0.92 1.13 1.10  Sodium 135 - 145 mmol/L - 140 -  Potassium 3.5 - 5.1 mmol/L - 4.3 -  Chloride 101 - 111 mmol/L - 106 -  CO2 22 - 32 mmol/L - 26 -  Calcium 8.9 - 10.3 mg/dL - 9.7 -    Risks and benefits of anesthesia discussed at length, patient or surrogate demonstrates understanding. Appropriately NPO. Plan to proceed with anesthesia.  Champ Mungo, MD 06/22/20

## 2020-06-22 NOTE — Op Note (Signed)
OPERATIVE NOTE  Alex Malone [id number] 06/22/2020   PREOPERATIVE DIAGNOSIS:   1.  Primary open angle glaucoma, severe, left eye.  2.  Nuclear sclerotic cataract, left eye.   POSTOPERATIVE DIAGNOSIS:     Same.   PROCEDURE:   1.  Ahmed tube shunt and placement of tutoplast CPT 66180 2.  Phacoemulsification of cataract and placement of intraocular lens. CPT [phone]  IMPLANT: Implant Name Type Inv. Item Serial No. Manufacturer Lot No. LRB No. Used Action  LENS IOL TECNIS EYHANCE 20.5 - [id number] Intraocular Lens LENS IOL TECNIS EYHANCE 20.5 [id number] [id number]   Left 1 Implanted  VALVE GLAUCOMA AHMED - [id number] Prosthesis & Implant Heart VALVE GLAUCOMA AHMED [id number] [employer] [id number] Left 1 Implanted  TUTOPLAST SCIERA 0.5X1.0 - [id number] Tissue TUTOPLAST SCIERA 0.5X1.0 [id number] RIT [id number] Left 1 Implanted       SURGEON:  Benay Pillow, MD, MPH   ANESTHESIA:  MAC and retrobulbar block with 50%/50% mix of 0.75% bupivicaine and 2% preservative free lidocaine with a small amount of vitrase.  ESTIMATED BLOOD LOSS: <1 mL   COMPLICATIONS:  None.  ANESTHESIOLOGIST: Anesthesiologist: Sinda Du, MD CRNA: Vanetta Shawl, CRNA; Cameron Ali, CRNA  ULTRASOUND TIME: 0 minutes 26 seconds.  CDE 3.04     DESCRIPTION OF PROCEDURE:  The patient was identified in the holding room and transported to the operating room and placed in the supine position.  A time out was called identifiying the left eye as the operative eye and a retrobulbar block was administered in the standard fashion.  It was prepped and draped in the usual sterile ophthalmic fashion.  A 1.0 millimeter clear-corneal paracentesis was made at the 5:30 position. 0.5 ml of preservative-free 1% lidocaine with epinephrine was injected into the anterior chamber. The anterior chamber was filled with Healon 5 viscoelastic.  A 2.4 millimeter keratome was used to make a near-clear corneal incision at the 2:30 position.  A  curvilinear capsulorrhexis was made with a cystotome and capsulorrhexis forceps.  Balanced salt solution was used to hydrodissect and hydrodelineate the nucleus.   Phacoemulsification was then used in stop and chop fashion to remove the lens nucleus and epinucleus.  The remaining cortex was then removed using the irrigation and aspiration handpiece. Healon was then placed into the capsular bag to distend it for lens placement.  A lens was then injected into the capsular bag.  The remaining viscoelastic was aspirated.   Wounds were hydrated with balanced salt solution.  The anterior chamber was inflated to a physiologic pressure with balanced salt solution.   A 10-0 nylon suture was placed through the clear corneal incision and the knot buried.    Attention was turned to the Ahmed tube procedure  A marking pen was used to mark the 12:00 and 3:00 positions at the limbus.   A 5-0 silk traction suture was placed through the cornea and the eye rotated to expose the superotemporal quadrant.  A corneal sponge was placed to keep the cornea moist.  The conj was dissected from the limbus and posteriorly.  Radial relaxing incisions were made in the conjunctiva.  A 19 gauge pencil cautery was used to obtain hemostasis for the exposed conjunctiva.  The calipers were set to 8.5 mm and the sclera was marked posterior to the limbus. The Ahmed tube shunt was brought onto the field and was primed with BSS on a 27 gauge cannula.  Visualization of the fluid from the plate was confirmed. The plate was secured  with two 9-0 nylon on a spatulated needle with partial thickness bites throught the sclera at the premarked sites and the sutures were buried in the eyelets of the plate.  The tube was trimmed to the appropriate length with an anterior bevel with westcott scissors.  It was relatively short.  A 23 gauge needle was used to create a short scleral tunnel and enter into the anterior chamber parallel to the iris.   The tube was inserted with tube inserter forceps and was in good position, not contacting the cornea.  The tutoplast was brought onto the field and trimmed and secured using 7-0 vicryl sutures.  The conjunctiva was reapproximated to the limbus with an 7-0 vicyrl suture.   Intracameral vigamox 0.1 mL undiluted was injected into the eye and a drop placed onto the ocular surface.  Subconjunctival kenalog was injected.   The lid speculum was removed, the face was cleaned with a wet and dry.  Maxitrol ointment, a patch and shield were placed over the left eye.   The patient was taken to the recovery room in stable condition without complications of anesthesia or surgery.  The patient is to leave the patch and shield in place and we will see them in the clinic for followup tomorrow.  Benay Pillow 06/22/2020, 2:29 PM

## 2020-06-22 NOTE — Transfer of Care (Signed)
Immediate Anesthesia Transfer of Care Note  Patient: Alex Malone  Procedure(s) Performed: CATARACT EXTRACTION PHACO AND INTRAOCULAR LENS PLACEMENT (IOC) LEFT AHMED TUBE SHUNT W/TUTOPLAST 3.04 00:26.7 (Left Eye)  Patient Location: PACU  Anesthesia Type: General, MAC  Level of Consciousness: awake, alert  and patient cooperative  Airway and Oxygen Therapy: Patient Spontanous Breathing and Patient connected to supplemental oxygen  Post-op Assessment: Post-op Vital signs reviewed, Patient's Cardiovascular Status Stable, Respiratory Function Stable, Patent Airway and No signs of Nausea or vomiting  Post-op Vital Signs: Reviewed and stable  Complications: No complications documented.

## 2020-06-22 NOTE — H&P (Signed)
[place]   Primary Care Physician:  Baxter Hire, MD Ophthalmologist: Dr. Benay Pillow  Pre-Procedure History & Physical: HPI:  Alex Malone is a 87 y.o. male here for cataract surgery.   Past Medical History:  Diagnosis Date  . Anxiety   . History of lacunar cerebrovascular accident (CVA)   . History of prostate cancer   . History of subdural hematoma   . HOH (hard of hearing)   . Hyperlipidemia   . Hyperlipidemia, unspecified 11/25/2012  . Hypertension   . Hyperuricemia     Past Surgical History:  Procedure Laterality Date  . BACK SURGERY    . CHOLECYSTECTOMY    . EVALUATION UNDER ANESTHESIA WITH HEMORRHOIDECTOMY    . HERNIA REPAIR    . INSERTION OF MESH N/A 07/11/2017   Procedure: INSERTION OF MESH;  Surgeon: Florene Glen, MD;  Location: [facility] ORS;  Service: General;  Laterality: N/A;  . PERINEAL PROSTATECTOMY    . subdermal hematoma    . VENTRAL HERNIA REPAIR N/A 07/11/2017   Procedure: HERNIA REPAIR RECURRENT VENTRAL ADULT;  Surgeon: Florene Glen, MD;  Location: [facility] ORS;  Service: General;  Laterality: N/A;    Prior to Admission medications   Medication Sig Start Date End Date Taking? Authorizing Provider  acetaminophen (TYLENOL) 325 MG tablet Take 650 mg by mouth every 6 (six) hours as needed.   Yes [provider]  ASPIRIN 81 PO Take by mouth daily.   Yes [provider]  brimonidine (ALPHAGAN) 0.2 % ophthalmic solution Place 1 drop into both eyes 2 (two) times daily. 07/25/16  Yes [provider]  diazepam (VALIUM) 2 MG tablet Take 2 mg by mouth every 6 (six) hours as needed for anxiety.   Yes [provider]  dorzolamide (TRUSOPT) 2 % ophthalmic solution 1 drop.   Yes [provider]  fluticasone (FLONASE) 50 MCG/ACT nasal spray Place into both nostrils daily.   Yes [provider]  latanoprost (XALATAN) 0.005 % ophthalmic solution 1 drop at bedtime.   Yes [provider]   lisinopril (PRINIVIL,ZESTRIL) 10 MG tablet Take 10 mg by mouth daily.   Yes [provider]  senna (SENOKOT) 8.6 MG tablet Take 1 tablet by mouth daily.   Yes [provider]  simvastatin (ZOCOR) 20 MG tablet Take 20 mg by mouth at bedtime.    Yes [provider]  timolol (BETIMOL) 0.5 % ophthalmic solution 1 drop.   Yes [provider]    Allergies as of 04/27/2020  . (No Known Allergies)    Family History  Problem Relation Age of Onset  . Kidney disease Mother   . Cancer Father     Social History   Socioeconomic History  . Marital status: Married    Spouse name: Not on file  . Number of children: Not on file  . Years of education: Not on file  . Highest education level: Not on file  Occupational History  . Not on file  Tobacco Use  . Smoking status: Never Smoker  . Smokeless tobacco: Never Used  Vaping Use  . Vaping Use: Never used  Substance and Sexual Activity  . Alcohol use: No    Comment: rare  . Drug use: No  . Sexual activity: Not on file  Other Topics Concern  . Not on file  Social History Narrative  . Not on file   Social Determinants of Health   Financial Resource Strain: Not on file  Food  Insecurity: Not on file  Transportation Needs: Not on file  Physical Activity: Not on file  Stress: Not on file  Social Connections: Not on file  Intimate Partner Violence: Not on file    Review of Systems: See HPI, otherwise negative ROS  Physical Exam: BP (!) 146/90   Pulse 63   Temp (!) 97 F (36.1 C) (Temporal)   Ht 6\' 2"  (1.88 m)   Wt 93.9 kg   SpO2 99%   BMI 26.58 kg/m  General:   Alert,  pleasant and cooperative in NAD Head:  Normocephalic and atraumatic. Respiratory:  Normal work of breathing. Cardiovascular:  RRR  Impression/Plan: Alex Malone is here for cataract surgery.  Risks, benefits, limitations, and alternatives regarding cataract surgery have been reviewed with the patient.  Questions have  been answered.  All parties agreeable.   Benay Pillow, MD  06/22/2020, 12:51 PM

## 2020-06-22 NOTE — Anesthesia Procedure Notes (Signed)
Procedure Name: MAC Date/Time: 06/22/2020 1:06 PM Performed by: Cameron Ali, CRNA Pre-anesthesia Checklist: Patient identified, Emergency Drugs available, Suction available, Timeout performed and Patient being monitored Patient Re-evaluated:Patient Re-evaluated prior to induction Oxygen Delivery Method: Nasal cannula Placement Confirmation: positive ETCO2

## 2020-06-23 ENCOUNTER — Encounter: Payer: Self-pay | Admitting: Ophthalmology

## 2020-07-29 DIAGNOSIS — R7302 Impaired glucose tolerance (oral): Secondary | ICD-10-CM | POA: Diagnosis not present

## 2020-08-05 DIAGNOSIS — R7302 Impaired glucose tolerance (oral): Secondary | ICD-10-CM | POA: Diagnosis not present

## 2020-08-05 DIAGNOSIS — E782 Mixed hyperlipidemia: Secondary | ICD-10-CM | POA: Diagnosis not present

## 2020-08-05 DIAGNOSIS — Z0000 Encounter for general adult medical examination without abnormal findings: Secondary | ICD-10-CM | POA: Diagnosis not present

## 2020-08-05 DIAGNOSIS — F419 Anxiety disorder, unspecified: Secondary | ICD-10-CM | POA: Diagnosis not present

## 2020-08-05 DIAGNOSIS — I10 Essential (primary) hypertension: Secondary | ICD-10-CM | POA: Diagnosis not present

## 2020-12-04 DIAGNOSIS — H401123 Primary open-angle glaucoma, left eye, severe stage: Secondary | ICD-10-CM | POA: Diagnosis not present

## 2021-02-10 DIAGNOSIS — R7302 Impaired glucose tolerance (oral): Secondary | ICD-10-CM | POA: Diagnosis not present

## 2021-02-10 DIAGNOSIS — E782 Mixed hyperlipidemia: Secondary | ICD-10-CM | POA: Diagnosis not present

## 2021-02-17 DIAGNOSIS — I10 Essential (primary) hypertension: Secondary | ICD-10-CM | POA: Diagnosis not present

## 2021-02-17 DIAGNOSIS — Z23 Encounter for immunization: Secondary | ICD-10-CM | POA: Diagnosis not present

## 2021-02-17 DIAGNOSIS — E782 Mixed hyperlipidemia: Secondary | ICD-10-CM | POA: Diagnosis not present

## 2021-02-17 DIAGNOSIS — Z0001 Encounter for general adult medical examination with abnormal findings: Secondary | ICD-10-CM | POA: Diagnosis not present

## 2021-02-17 DIAGNOSIS — F419 Anxiety disorder, unspecified: Secondary | ICD-10-CM | POA: Diagnosis not present

## 2021-02-17 DIAGNOSIS — R7302 Impaired glucose tolerance (oral): Secondary | ICD-10-CM | POA: Diagnosis not present

## 2021-05-26 DIAGNOSIS — H401123 Primary open-angle glaucoma, left eye, severe stage: Secondary | ICD-10-CM | POA: Diagnosis not present

## 2021-06-15 DIAGNOSIS — H401133 Primary open-angle glaucoma, bilateral, severe stage: Secondary | ICD-10-CM | POA: Diagnosis not present

## 2021-06-15 DIAGNOSIS — H5212 Myopia, left eye: Secondary | ICD-10-CM | POA: Diagnosis not present

## 2021-08-10 DIAGNOSIS — R7302 Impaired glucose tolerance (oral): Secondary | ICD-10-CM | POA: Diagnosis not present

## 2021-08-18 DIAGNOSIS — I10 Essential (primary) hypertension: Secondary | ICD-10-CM | POA: Diagnosis not present

## 2021-08-18 DIAGNOSIS — Z1389 Encounter for screening for other disorder: Secondary | ICD-10-CM | POA: Diagnosis not present

## 2021-08-18 DIAGNOSIS — F419 Anxiety disorder, unspecified: Secondary | ICD-10-CM | POA: Diagnosis not present

## 2021-08-18 DIAGNOSIS — R7302 Impaired glucose tolerance (oral): Secondary | ICD-10-CM | POA: Diagnosis not present

## 2021-08-18 DIAGNOSIS — E782 Mixed hyperlipidemia: Secondary | ICD-10-CM | POA: Diagnosis not present

## 2021-08-18 DIAGNOSIS — Z0000 Encounter for general adult medical examination without abnormal findings: Secondary | ICD-10-CM | POA: Diagnosis not present

## 2021-12-29 DIAGNOSIS — H401133 Primary open-angle glaucoma, bilateral, severe stage: Secondary | ICD-10-CM | POA: Diagnosis not present

## 2022-01-04 DIAGNOSIS — H401133 Primary open-angle glaucoma, bilateral, severe stage: Secondary | ICD-10-CM | POA: Diagnosis not present

## 2022-02-23 DIAGNOSIS — R7302 Impaired glucose tolerance (oral): Secondary | ICD-10-CM | POA: Diagnosis not present

## 2022-02-23 DIAGNOSIS — I10 Essential (primary) hypertension: Secondary | ICD-10-CM | POA: Diagnosis not present

## 2022-03-01 DIAGNOSIS — R7302 Impaired glucose tolerance (oral): Secondary | ICD-10-CM | POA: Diagnosis not present

## 2022-03-01 DIAGNOSIS — I10 Essential (primary) hypertension: Secondary | ICD-10-CM | POA: Diagnosis not present

## 2022-03-01 DIAGNOSIS — F419 Anxiety disorder, unspecified: Secondary | ICD-10-CM | POA: Diagnosis not present

## 2022-03-01 DIAGNOSIS — E782 Mixed hyperlipidemia: Secondary | ICD-10-CM | POA: Diagnosis not present

## 2022-03-01 DIAGNOSIS — Z0001 Encounter for general adult medical examination with abnormal findings: Secondary | ICD-10-CM | POA: Diagnosis not present

## 2022-06-22 ENCOUNTER — Ambulatory Visit
Admission: RE | Admit: 2022-06-22 | Discharge: 2022-06-22 | Disposition: A | Discharge status: Home / Self Care | Payer: Medicare HMO | Source: Ambulatory Visit

## 2022-06-22 VITALS — BP 172/108 | HR 72 | Temp 98.0°F | Resp 18

## 2022-06-22 DIAGNOSIS — L600 Ingrowing nail: Secondary | ICD-10-CM | POA: Diagnosis not present

## 2022-06-22 NOTE — Discharge Instructions (Signed)
Trim the corner of your toenail so that the sharp edge does not cause you pain.  Call Dr. Rosann Auerbach office to schedule an appointment to have your toenails cut.

## 2022-06-22 NOTE — ED Triage Notes (Signed)
Patient presents to UC for left big toe pain x 2 weeks. Taking tylenol for pain. Hx of gout.

## 2022-06-22 NOTE — ED Provider Notes (Signed)
[name]    CSN: [id number] Arrival date & time: 06/22/22  1129      History   Chief Complaint Chief Complaint  Patient presents with   Toe Pain    Entered by patient    HPI Alex Malone is a 89 y.o. male.   HPI  89 year old man with a significant past medical history for hyperlipidemia, hypertension hyperuricemia, lacunar CVA, prostate CA, subdural presenting for evaluation of left big toe pain x 2 weeks.  He has been taking over-the-counter Tylenol for the pain.  Past Medical History:  Diagnosis Date   Anxiety    History of lacunar cerebrovascular accident (CVA)    History of prostate cancer    History of subdural hematoma    HOH (hard of hearing)    Hyperlipidemia    Hyperlipidemia, unspecified 11/25/2012   Hypertension    Hyperuricemia     Patient Active Problem List   Diagnosis Date Noted   IGT (impaired glucose tolerance) 01/26/2018   Cataract, nuclear sclerotic, both eyes 12/25/2017   Chronic primary angle-closure glaucoma of both eyes, severe stage 12/25/2017   Recurrent ventral hernia    Small bowel obstruction ([place])    Anxiety 02/20/2017   Vertigo 02/20/2017   Hyperuricemia 08/11/2016   History of lacunar cerebrovascular accident (CVA) 01/07/2014   History of prostate cancer 01/07/2014   History of subdural hematoma 01/07/2014   Hyperlipidemia, unspecified 11/25/2012   Hypertension 11/25/2012    Past Surgical History:  Procedure Laterality Date   BACK SURGERY     CATARACT EXTRACTION W/PHACO Left 06/22/2020   Procedure: CATARACT EXTRACTION PHACO AND INTRAOCULAR LENS PLACEMENT ([place]) LEFT AHMED TUBE SHUNT W/TUTOPLAST 3.04 00:26.7;  Surgeon: Eulogio Bear, MD;  Location: [place];  Service: Ophthalmology;  Laterality: Left;  90 minute case per Leveda Anna   CHOLECYSTECTOMY     EVALUATION UNDER ANESTHESIA WITH HEMORRHOIDECTOMY     HERNIA REPAIR     INSERTION OF MESH N/A 07/11/2017   Procedure: INSERTION OF MESH;  Surgeon:  Florene Glen, MD;  Location: [facility];  Service: General;  Laterality: N/A;   PERINEAL PROSTATECTOMY     subdermal hematoma     VENTRAL HERNIA REPAIR N/A 07/11/2017   Procedure: HERNIA REPAIR RECURRENT VENTRAL ADULT;  Surgeon: Florene Glen, MD;  Location: [facility];  Service: General;  Laterality: N/A;       Home Medications    Prior to Admission medications   Medication Sig Start Date End Date Taking? Authorizing Provider  Meclizine HCl 25 MG CHEW Chew 1 tablet by mouth daily. 10/04/17  Yes [provider]  acetaminophen (TYLENOL) 325 MG tablet Take 650 mg by mouth every 6 (six) hours as needed.    [provider]  ASPIRIN 81 PO Take by mouth daily.    [provider]  brimonidine (ALPHAGAN) 0.2 % ophthalmic solution Place 1 drop into both eyes 2 (two) times daily. 07/25/16   [provider]  diazepam (VALIUM) 2 MG tablet Take 2 mg by mouth every 6 (six) hours as needed for anxiety.    [provider]  dorzolamide (TRUSOPT) 2 % ophthalmic solution 1 drop.    [provider]  fluticasone (FLONASE) 50 MCG/ACT nasal spray Place into both nostrils daily.    [provider]  latanoprost (XALATAN) 0.005 % ophthalmic solution 1 drop at bedtime.    [provider]  lisinopril (PRINIVIL,ZESTRIL) 10 MG tablet Take 10 mg by mouth daily.    [provider]  senna (SENOKOT) 8.6 MG tablet Take 1 tablet by mouth daily.    [provider]  simvastatin (ZOCOR) 20 MG tablet Take 20 mg by mouth at bedtime.     [provider]  timolol (BETIMOL) 0.5 % ophthalmic solution 1 drop.    [provider]    Family History Family History  Problem Relation Age of Onset   Kidney disease Mother    Cancer Father     Social History Social History   Tobacco Use   Smoking status: Never   Smokeless tobacco: Never  Vaping Use   Vaping Use: Never used  Substance Use Topics   Alcohol use: No     Comment: rare   Drug use: No     Allergies   Patient has no known allergies.   Review of Systems Review of Systems  Musculoskeletal:  Negative for arthralgias and joint swelling.       Pain at left great toenail.  Skin:  Negative for color change.     Physical Exam Triage Vital Signs ED Triage Vitals [06/22/22 1144]  Enc Vitals Group     BP      Pulse      Resp      Temp      Temp src      SpO2      Weight      Height      Head Circumference      Peak Flow      Pain Score 4     Pain Loc      Pain Edu?      Excl. in [place]?    No data found.  Updated Vital Signs BP (!) 172/108 (BP Location: Left Arm)   Pulse 72   Temp 98 F (36.7 C) (Oral)   Resp 18   SpO2 95%   Visual Acuity Right Eye Distance:   Left Eye Distance:   Bilateral Distance:    Right Eye Near:   Left Eye Near:    Bilateral Near:     Physical Exam Vitals and nursing note reviewed.  Constitutional:      Appearance: Normal appearance.  Musculoskeletal:        General: Tenderness present. No swelling, deformity or signs of injury. Normal range of motion.  Skin:    General: Skin is warm and dry.     Capillary Refill: Capillary refill takes less than 2 seconds.     Findings: No erythema.  Neurological:     General: No focal deficit present.     Mental Status: He is alert and oriented to person, place, and time.      UC Treatments / Results  Labs (all labs ordered are listed, but only abnormal results are displayed) Labs Reviewed - No data to display  EKG   Radiology No results found.  Procedures Procedures (including critical care time)  Medications Ordered in UC Medications - No data to display  Initial Impression / Assessment and Plan / UC Course  I have reviewed the triage vital signs and the nursing notes.  Pertinent labs & imaging results that were available during my care of the patient were reviewed by me and considered in my medical decision making (see chart for  details).   The patient is a pleasant 89 year old male presenting for evaluation of pain to the left great toenail that has been off and on for the past 2 weeks.  He states he mostly feels  it at night when the covers light on his toes.  He does have a history of gout and he has had previous flares in the same toe.  He is not on any gout prevention and he has never sought care when he had a flare in the past.  On exam patient does not have any erythema or edema to the toe and there is no tenderness with palpation of the phalanges, IP joint, or MTP joint.  The patient's left great toenail is curling under on the medial aspect and has a sharp corner which is where he is complaining of pain.  I have advised him that he should clipped the corner to help with his discomfort and then make an appointment with Dr. Cleda Mccreedy at [facility], whom he has seen before to have his toenails cut, and make an appointment for same.   Final Clinical Impressions(s) / UC Diagnoses   Final diagnoses:  Ingrown left greater toenail     Discharge Instructions      Trim the corner of your toenail so that the sharp edge does not cause you pain.  Call Dr. Rosann Auerbach office to schedule an appointment to have your toenails cut.     ED Prescriptions   None    PDMP not reviewed this encounter.   Margarette Canada, NP 06/22/22 1200

## 2022-06-29 DIAGNOSIS — H401133 Primary open-angle glaucoma, bilateral, severe stage: Secondary | ICD-10-CM | POA: Diagnosis not present

## 2022-07-05 DIAGNOSIS — M79674 Pain in right toe(s): Secondary | ICD-10-CM | POA: Diagnosis not present

## 2022-07-05 DIAGNOSIS — M79675 Pain in left toe(s): Secondary | ICD-10-CM | POA: Diagnosis not present

## 2022-07-05 DIAGNOSIS — B351 Tinea unguium: Secondary | ICD-10-CM | POA: Diagnosis not present

## 2022-07-12 DIAGNOSIS — Z0100 Encounter for examination of eyes and vision without abnormal findings: Secondary | ICD-10-CM | POA: Diagnosis not present

## 2022-07-12 DIAGNOSIS — H401133 Primary open-angle glaucoma, bilateral, severe stage: Secondary | ICD-10-CM | POA: Diagnosis not present

## 2022-08-29 DIAGNOSIS — R7302 Impaired glucose tolerance (oral): Secondary | ICD-10-CM | POA: Diagnosis not present

## 2022-09-03 ENCOUNTER — Other Ambulatory Visit: Payer: Self-pay

## 2022-09-03 ENCOUNTER — Encounter: Payer: Self-pay | Admitting: *Deleted

## 2022-09-03 ENCOUNTER — Ambulatory Visit (INDEPENDENT_AMBULATORY_CARE_PROVIDER_SITE_OTHER)
Admission: EM | Admit: 2022-09-03 | Discharge: 2022-09-03 | Disposition: A | Discharge status: Home / Self Care | Payer: Medicare HMO | Source: Home / Self Care

## 2022-09-03 ENCOUNTER — Emergency Department: Payer: Medicare HMO

## 2022-09-03 ENCOUNTER — Ambulatory Visit (INDEPENDENT_AMBULATORY_CARE_PROVIDER_SITE_OTHER): Payer: Medicare HMO

## 2022-09-03 ENCOUNTER — Emergency Department
Admission: EM | Admit: 2022-09-03 | Discharge: 2022-09-03 | Disposition: A | Discharge status: Home / Self Care | Payer: Medicare HMO | Attending: Emergency Medicine | Admitting: Emergency Medicine

## 2022-09-03 ENCOUNTER — Encounter: Payer: Self-pay | Admitting: Emergency Medicine

## 2022-09-03 DIAGNOSIS — Z9049 Acquired absence of other specified parts of digestive tract: Secondary | ICD-10-CM | POA: Insufficient documentation

## 2022-09-03 DIAGNOSIS — K567 Ileus, unspecified: Secondary | ICD-10-CM | POA: Insufficient documentation

## 2022-09-03 DIAGNOSIS — R109 Unspecified abdominal pain: Secondary | ICD-10-CM | POA: Diagnosis not present

## 2022-09-03 DIAGNOSIS — I10 Essential (primary) hypertension: Secondary | ICD-10-CM | POA: Diagnosis not present

## 2022-09-03 DIAGNOSIS — K449 Diaphragmatic hernia without obstruction or gangrene: Secondary | ICD-10-CM | POA: Insufficient documentation

## 2022-09-03 DIAGNOSIS — I700 Atherosclerosis of aorta: Secondary | ICD-10-CM | POA: Insufficient documentation

## 2022-09-03 DIAGNOSIS — K4030 Unilateral inguinal hernia, with obstruction, without gangrene, not specified as recurrent: Secondary | ICD-10-CM | POA: Diagnosis not present

## 2022-09-03 DIAGNOSIS — Z8673 Personal history of transient ischemic attack (TIA), and cerebral infarction without residual deficits: Secondary | ICD-10-CM | POA: Diagnosis not present

## 2022-09-03 DIAGNOSIS — R1084 Generalized abdominal pain: Secondary | ICD-10-CM | POA: Insufficient documentation

## 2022-09-03 DIAGNOSIS — K4090 Unilateral inguinal hernia, without obstruction or gangrene, not specified as recurrent: Secondary | ICD-10-CM

## 2022-09-03 DIAGNOSIS — K5730 Diverticulosis of large intestine without perforation or abscess without bleeding: Secondary | ICD-10-CM | POA: Insufficient documentation

## 2022-09-03 DIAGNOSIS — R1030 Lower abdominal pain, unspecified: Secondary | ICD-10-CM | POA: Diagnosis present

## 2022-09-03 DIAGNOSIS — Z8719 Personal history of other diseases of the digestive system: Secondary | ICD-10-CM

## 2022-09-03 LAB — CBC WITH DIFFERENTIAL/PLATELET
Abs Immature Granulocytes: 0.01 10*3/uL (ref 0.00–0.07)
Basophils Absolute: 0 10*3/uL (ref 0.0–0.1)
Basophils Relative: 1 %
Eosinophils Absolute: 0.5 10*3/uL (ref 0.0–0.5)
Eosinophils Relative: 6 %
HCT: 49 % (ref 39.0–52.0)
Hemoglobin: 15.6 g/dL (ref 13.0–17.0)
Immature Granulocytes: 0 %
Lymphocytes Relative: 30 %
Lymphs Abs: 2.5 10*3/uL (ref 0.7–4.0)
MCH: 29.4 pg (ref 26.0–34.0)
MCHC: 31.8 g/dL (ref 30.0–36.0)
MCV: 92.3 fL (ref 80.0–100.0)
Monocytes Absolute: 0.6 10*3/uL (ref 0.1–1.0)
Monocytes Relative: 7 %
Neutro Abs: 4.7 10*3/uL (ref 1.7–7.7)
Neutrophils Relative %: 56 %
Platelets: 206 10*3/uL (ref 150–400)
RBC: 5.31 MIL/uL (ref 4.22–5.81)
RDW: 14.2 % (ref 11.5–15.5)
WBC: 8.2 10*3/uL (ref 4.0–10.5)
nRBC: 0 % (ref 0.0–0.2)

## 2022-09-03 LAB — COMPREHENSIVE METABOLIC PANEL
ALT: 16 U/L (ref 0–44)
AST: 28 U/L (ref 15–41)
Albumin: 3.7 g/dL (ref 3.5–5.0)
Alkaline Phosphatase: 38 U/L (ref 38–126)
Anion gap: 9 (ref 5–15)
BUN: 11 mg/dL (ref 8–23)
CO2: 23 mmol/L (ref 22–32)
Calcium: 9.7 mg/dL (ref 8.9–10.3)
Chloride: 104 mmol/L (ref 98–111)
Creatinine, Ser: 1.11 mg/dL (ref 0.61–1.24)
GFR, Estimated: >60 mL/min (ref >60)
Glucose, Bld: 104 mg/dL — ABNORMAL HIGH (ref 70–99)
Potassium: 3.8 mmol/L (ref 3.5–5.1)
Sodium: 136 mmol/L (ref 135–145)
Total Bilirubin: 0.7 mg/dL (ref 0.3–1.2)
Total Protein: 7.4 g/dL (ref 6.5–8.1)

## 2022-09-03 LAB — URINALYSIS, W/ REFLEX TO CULTURE (INFECTION SUSPECTED)
Bilirubin Urine: NEGATIVE
Glucose, UA: NEGATIVE mg/dL
Hgb urine dipstick: NEGATIVE
Ketones, ur: NEGATIVE mg/dL
Leukocytes,Ua: NEGATIVE
Nitrite: NEGATIVE
Protein, ur: NEGATIVE mg/dL
Specific Gravity, Urine: 1.02 (ref 1.005–1.030)
pH: 7 (ref 5.0–8.0)

## 2022-09-03 LAB — LIPASE, BLOOD: Lipase: 29 U/L (ref 11–51)

## 2022-09-03 MED ORDER — TRAMADOL HCL 50 MG PO TABS: 50.0000 mg | ORAL_TABLET | Freq: Four times a day (QID) | ORAL | 0 refills | Status: AC | PRN

## 2022-09-03 MED ORDER — IOHEXOL 300 MG/ML  SOLN
100.0000 mL | Freq: Once | INTRAMUSCULAR | Status: AC | PRN
  Administered 2022-09-03: 100 mL via INTRAVENOUS

## 2022-09-03 NOTE — ED Provider Notes (Signed)
[name]    CSN: [id number] Arrival date & time: 09/03/22  1304      History   Chief Complaint Chief Complaint  Patient presents with   Abdominal Pain    HPI Alex Malone is a 89 y.o. male presenting with his daughter for evaluation of generalized abdominal pain x 1 month which has been worsening over the past couple of days.  He reports pain and difficulty having bowel movements.  He has been taking MiraLAX and drinking.  Juice and was able to have a small bowel movement at the urgent care today.  He reports a lot of pain when trying to pass stool.  He denies nausea, vomiting or diarrhea.  No black or bloody stool.  He does have a history of abdominal hernia and states that he has had 6 hernia surgeries.  He is mostly having pain around the periumbilical region where he had the hernia repair.  Denies associated fever, fatigue.  No urinary symptoms.  No other complaints.  HPI  Past Medical History:  Diagnosis Date   Anxiety    History of lacunar cerebrovascular accident (CVA)    History of prostate cancer    History of subdural hematoma    HOH (hard of hearing)    Hyperlipidemia    Hyperlipidemia, unspecified 11/25/2012   Hypertension    Hyperuricemia     Patient Active Problem List   Diagnosis Date Noted   IGT (impaired glucose tolerance) 01/26/2018   Cataract, nuclear sclerotic, both eyes 12/25/2017   Chronic primary angle-closure glaucoma of both eyes, severe stage 12/25/2017   Recurrent ventral hernia    Small bowel obstruction ([facility])    Anxiety 02/20/2017   Vertigo 02/20/2017   Hyperuricemia 08/11/2016   History of lacunar cerebrovascular accident (CVA) 01/07/2014   History of prostate cancer 01/07/2014   History of subdural hematoma 01/07/2014   Hyperlipidemia, unspecified 11/25/2012   Hypertension 11/25/2012    Past Surgical History:  Procedure Laterality Date   BACK SURGERY     CATARACT EXTRACTION W/PHACO Left 06/22/2020   Procedure: CATARACT  EXTRACTION PHACO AND INTRAOCULAR LENS PLACEMENT ([facility]) LEFT AHMED TUBE SHUNT W/TUTOPLAST 3.04 00:26.7;  Surgeon: Nevada Crane, MD;  Location: [facility];  Service: Ophthalmology;  Laterality: Left;  90 minute case per Lennox Laity   CHOLECYSTECTOMY     EVALUATION UNDER ANESTHESIA WITH HEMORRHOIDECTOMY     HERNIA REPAIR     INSERTION OF MESH N/A 07/11/2017   Procedure: INSERTION OF MESH;  Surgeon: Lattie Haw, MD;  Location: [facility];  Service: General;  Laterality: N/A;   PERINEAL PROSTATECTOMY     subdermal hematoma     VENTRAL HERNIA REPAIR N/A 07/11/2017   Procedure: HERNIA REPAIR RECURRENT VENTRAL ADULT;  Surgeon: Lattie Haw, MD;  Location: [facility];  Service: General;  Laterality: N/A;       Home Medications    Prior to Admission medications   Medication Sig Start Date End Date Taking? Authorizing Provider  acetaminophen (TYLENOL) 325 MG tablet Take 650 mg by mouth every 6 (six) hours as needed.    [provider]  ASPIRIN 81 PO Take by mouth daily.    [provider]  brimonidine (ALPHAGAN) 0.2 % ophthalmic solution Place 1 drop into both eyes 2 (two) times daily. 07/25/16   [provider]  diazepam (VALIUM) 2 MG tablet Take 2 mg by mouth every 6 (six) hours as needed for anxiety.    [provider]  dorzolamide (  TRUSOPT) 2 % ophthalmic solution 1 drop.    [provider]  fluticasone (FLONASE) 50 MCG/ACT nasal spray Place into both nostrils daily.    [provider]  latanoprost (XALATAN) 0.005 % ophthalmic solution 1 drop at bedtime.    [provider]  lisinopril (PRINIVIL,ZESTRIL) 10 MG tablet Take 10 mg by mouth daily.    [provider]  Meclizine HCl 25 MG CHEW Chew 1 tablet by mouth daily. 10/04/17   [provider]  senna (SENOKOT) 8.6 MG tablet Take 1 tablet by mouth daily.    [provider]  simvastatin (ZOCOR) 20 MG tablet Take 20 mg by mouth at bedtime.      [provider]  timolol (BETIMOL) 0.5 % ophthalmic solution 1 drop.    [provider]    Family History Family History  Problem Relation Age of Onset   Kidney disease Mother    Cancer Father     Social History Social History   Tobacco Use   Smoking status: Never   Smokeless tobacco: Never  Vaping Use   Vaping Use: Never used  Substance Use Topics   Alcohol use: No    Comment: rare   Drug use: No     Allergies   Patient has no known allergies.   Review of Systems Review of Systems  Constitutional:  Negative for appetite change, fatigue and fever.  Respiratory:  Negative for shortness of breath.   Cardiovascular:  Negative for chest pain.  Gastrointestinal:  Positive for abdominal distention, abdominal pain and constipation. Negative for anal bleeding, blood in stool, diarrhea, nausea, rectal pain and vomiting.  Genitourinary:  Negative for difficulty urinating, flank pain, frequency and urgency.  Neurological:  Negative for weakness.     Physical Exam Triage Vital Signs ED Triage Vitals  Enc Vitals Group     BP 09/03/22 1351 (!) 152/87     Pulse Rate 09/03/22 1351 62     Resp 09/03/22 1351 15     Temp 09/03/22 1351 97.9 F (36.6 C)     Temp Source 09/03/22 1351 Oral     SpO2 09/03/22 1351 94 %     Weight 09/03/22 1317 207 lb 0.2 oz (93.9 kg)     Height 09/03/22 1317 6\' 2"  (1.88 m)     Head Circumference --      Peak Flow --      Pain Score 09/03/22 1317 8     Pain Loc --      Pain Edu? --      Excl. in [place]? --    No data found.  Updated Vital Signs BP (!) 152/87 (BP Location: Right Arm)   Pulse 62   Temp 97.9 F (36.6 C) (Oral)   Resp 15   Ht 6\' 2"  (1.88 m)   Wt 207 lb 0.2 oz (93.9 kg)   SpO2 94%   BMI 26.58 kg/m    Physical Exam Vitals and nursing note reviewed.  Constitutional:      General: He is not in acute distress.    Appearance: Normal appearance. He is well-developed. He is not ill-appearing.  HENT:     Head:  Normocephalic and atraumatic.  Eyes:     General: No scleral icterus.    Conjunctiva/sclera: Conjunctivae normal.  Cardiovascular:     Rate and Rhythm: Normal rate and regular rhythm.     Heart sounds: No murmur heard. Pulmonary:     Effort: Pulmonary effort is normal. No respiratory  distress.     Breath sounds: Normal breath sounds.  Abdominal:     General: Bowel sounds are decreased. There is distension (mildly firm).     Tenderness: There is abdominal tenderness (generalized, most TTP of periumbilcal region with scar noted on abdomen (umbilicus)). There is guarding (periumbilical). There is no right CVA tenderness or left CVA tenderness.  Musculoskeletal:     Cervical back: Neck supple.  Skin:    General: Skin is warm and dry.     Capillary Refill: Capillary refill takes less than 2 seconds.  Neurological:     General: No focal deficit present.     Mental Status: He is alert. Mental status is at baseline.     Motor: No weakness.     Gait: Gait normal.  Psychiatric:        Mood and Affect: Mood normal.        Behavior: Behavior normal.      UC Treatments / Results  Labs (all labs ordered are listed, but only abnormal results are displayed) Labs Reviewed  URINALYSIS, W/ REFLEX TO CULTURE (INFECTION SUSPECTED) - Abnormal; Notable for the following components:      Result Value   Bacteria, UA FEW (*)    All other components within normal limits    EKG   Radiology DG Abdomen 1 View  Result Date: 09/03/2022 CLINICAL DATA:  Abdominal pain EXAM: ABDOMEN - 1 VIEW COMPARISON:  Previous studies including the CT done on 07-03-2017. FINDINGS: There is mild dilation of gas-filled small bowel loops in mid abdomen. Gas and stool are seen in colon. Stomach is not distended. Surgical clips are seen in gallbladder fossa. Surgical clips are seen in pelvis. Arterial calcifications are seen. IMPRESSION: There is mild dilation of small bowel loops suggesting ileus. Less likely possibility  would be early partial small bowel obstruction. Arteriosclerosis. Electronically Signed   By: Ernie Avena M.D.   On: 09/03/2022 14:09    Procedures Procedures (including critical care time)  Medications Ordered in UC Medications - No data to display  Initial Impression / Assessment and Plan / UC Course  I have reviewed the triage vital signs and the nursing notes.  Pertinent labs & imaging results that were available during my care of the patient were reviewed by me and considered in my medical decision making (see chart for details).   89 year old male with history of hernias and small bowel obstruction presents for 1 month history of worsening abdominal pain which has gotten much worse over the past day.  The pain is generalized but worse over the periumbilical region.  He reports constipation and pain when having BMs.  He did have a BM today.  No black or bloody stool.  No fever.  No urinary symptoms.  Has appointment with his doctor in 2 days and wants to try to wait to the appointment.  Blood pressure elevated at 150/87.  Other vitals normal and stable.  He is ill-appearing and nontoxic.  His abdomen is mildly firm and distended.  He has generalized tenderness to palpation but especially over the periumbilical region with guarding in this area.  Urine without signs of infection.  KUB shows suspected ileus and possible early partial small bowel obstruction.  I reviewed these results with patient and his daughter.  Given his significant history for multiple surgeries, he likely has adhesions and he does have a high risk of having a bowel obstruction.  Since he has been having pain of the abdomen for over  a month advised that he really does need to be seen in the emergency department at this time to have CT scan, GI consult, further management.  They still would like to try to wait to see PCP on Monday.  Explained that if they wait symptoms could get much worse and could lead to a  complete bowel obstruction or worse.  They state they will go to [facility] ED at this time.  He is leaving in stable condition.   Final Clinical Impressions(s) / UC Diagnoses   Final diagnoses:  Ileus (HCC)  Generalized abdominal pain  History of small bowel obstruction     Discharge Instructions      -X-ray shows an ileus and possibly early bowel obstruction.  You need a CT scan and more workup in the emergency department.  Please do not wait until Monday to be seen and go to the emergency department at this time.  You have been advised to follow up immediately in the emergency department for concerning signs.symptoms. If you declined EMS transport, please have a family member take you directly to the ED at this time. Do not delay. Based on concerns about condition, if you do not follow up in th e ED, you may risk poor outcomes including worsening of condition, delayed treatment and potentially life threatening issues. If you have declined to go to the ED at this time, you should call your PCP immediately to set up a follow up appointment.  Go to ED for red flag symptoms, including; fevers you cannot reduce with Tylenol/Motrin, severe headaches, vision changes, numbness/weakness in part of the body, lethargy, confusion, intractable vomiting, severe dehydration, chest pain, breathing difficulty, severe persistent abdominal or pelvic pain, signs of severe infection (increased redness, swelling of an area), feeling faint or passing out, dizziness, etc. You should especially go to the ED for sudden acute worsening of condition if you do not elect to go at this time.      ED Prescriptions   None    PDMP not reviewed this encounter.   Shirlee Latch, PA-C 09/03/22 1425

## 2022-09-03 NOTE — ED Notes (Signed)
First Nurse Note: Pt to ED via [facility] from [facility] urgent Care for possible SBO.

## 2022-09-03 NOTE — Consult Note (Signed)
SURGICAL CONSULTATION NOTE   HISTORY OF PRESENT ILLNESS (HPI):  89 y.o. male presented to [facility] ED for evaluation of lower abdominal pain. Patient reports he started having right lower quadrant pain about a week ago.  He endorses that the pain intensified the last few days.  Pain localized to the right groin.  Pain radiates to the right leg.  He denies any nausea or vomiting.  Patient initially went to the urgent care and he had an abdominal x-ray concerning for ileus versus bowel obstruction.  He was sent to the ED for further evaluation.  At the ED he had a CT scan of the abdomen and pelvis that showed irregular hernia with obstruction.  Labs shows no leukocytosis.  No significant electrolyte disturbance.  Hernia was able to be reduced.  Patient denies any abdominal pain.  He denies any nausea or vomiting.  Surgery is consulted by Dr. Derrill Kay in this context for evaluation and management of inguinal hernia with obstruction.  PAST MEDICAL HISTORY (PMH):  Past Medical History:  Diagnosis Date   Anxiety    History of lacunar cerebrovascular accident (CVA)    History of prostate cancer    History of subdural hematoma    HOH (hard of hearing)    Hyperlipidemia    Hyperlipidemia, unspecified 11/25/2012   Hypertension    Hyperuricemia      PAST SURGICAL HISTORY (PSH):  Past Surgical History:  Procedure Laterality Date   BACK SURGERY     CATARACT EXTRACTION W/PHACO Left 06/22/2020   Procedure: CATARACT EXTRACTION PHACO AND INTRAOCULAR LENS PLACEMENT (IOC) LEFT AHMED TUBE SHUNT W/TUTOPLAST 3.04 00:26.7;  Surgeon: Nevada Crane, MD;  Location: [facility];  Service: Ophthalmology;  Laterality: Left;  90 minute case per Lennox Laity   CHOLECYSTECTOMY     EVALUATION UNDER ANESTHESIA WITH HEMORRHOIDECTOMY     HERNIA REPAIR     INSERTION OF MESH N/A 07/11/2017   Procedure: INSERTION OF MESH;  Surgeon: Lattie Haw, MD;  Location: [facility];  Service: General;  Laterality: N/A;    PERINEAL PROSTATECTOMY     subdermal hematoma     VENTRAL HERNIA REPAIR N/A 07/11/2017   Procedure: HERNIA REPAIR RECURRENT VENTRAL ADULT;  Surgeon: Lattie Haw, MD;  Location: [facility];  Service: General;  Laterality: N/A;     MEDICATIONS:  Prior to Admission medications   Medication Sig Start Date End Date Taking? Authorizing Provider  traMADol (ULTRAM) 50 MG tablet Take 1 tablet (50 mg total) by mouth every 6 (six) hours as needed for severe pain. 09/03/22 09/03/23 Yes Phineas Semen, MD  acetaminophen (TYLENOL) 325 MG tablet Take 650 mg by mouth every 6 (six) hours as needed.    [provider]  ASPIRIN 81 PO Take by mouth daily.    [provider]  brimonidine (ALPHAGAN) 0.2 % ophthalmic solution Place 1 drop into both eyes 2 (two) times daily. 07/25/16   [provider]  diazepam (VALIUM) 2 MG tablet Take 2 mg by mouth every 6 (six) hours as needed for anxiety.    [provider]  dorzolamide (TRUSOPT) 2 % ophthalmic solution 1 drop.    [provider]  fluticasone (FLONASE) 50 MCG/ACT nasal spray Place into both nostrils daily.    [provider]  latanoprost (XALATAN) 0.005 % ophthalmic solution 1 drop at bedtime.    [provider]  lisinopril (PRINIVIL,ZESTRIL) 10 MG tablet Take 10 mg by mouth daily.    [provider]  Meclizine HCl  25 MG CHEW Chew 1 tablet by mouth daily. 10/04/17   [provider]  senna (SENOKOT) 8.6 MG tablet Take 1 tablet by mouth daily.    [provider]  simvastatin (ZOCOR) 20 MG tablet Take 20 mg by mouth at bedtime.     [provider]  timolol (BETIMOL) 0.5 % ophthalmic solution 1 drop.    [provider]     ALLERGIES:  No Known Allergies   SOCIAL HISTORY:  Social History   Socioeconomic History   Marital status: Married    Spouse name: Not on file   Number of children: Not on file   Years of education: Not on file   Highest education  level: Not on file  Occupational History   Not on file  Tobacco Use   Smoking status: Never   Smokeless tobacco: Never  Vaping Use   Vaping Use: Never used  Substance and Sexual Activity   Alcohol use: No    Comment: rare   Drug use: No   Sexual activity: Not on file  Other Topics Concern   Not on file  Social History Narrative   Not on file   Social Determinants of Health   Financial Resource Strain: Not on file  Food Insecurity: Not on file  Transportation Needs: Not on file  Physical Activity: Not on file  Stress: Not on file  Social Connections: Not on file  Intimate Partner Violence: Not on file      FAMILY HISTORY:  Family History  Problem Relation Age of Onset   Kidney disease Mother    Cancer Father      REVIEW OF SYSTEMS:  Constitutional: denies weight loss, fever, chills, or sweats  Eyes: denies any other vision changes, history of eye injury  ENT: denies sore throat, hearing problems  Respiratory: denies shortness of breath, wheezing  Cardiovascular: denies chest pain, palpitations  Gastrointestinal: Positive abdominal pain, negative nausea and vomiting Genitourinary: denies burning with urination or urinary frequency Musculoskeletal: denies any other joint pains or cramps  Skin: denies any other rashes or skin discolorations  Neurological: denies any other headache, dizziness, weakness  Psychiatric: denies any other depression, anxiety   All other review of systems were negative   VITAL SIGNS:  Temp:  [97.7 F (36.5 C)-97.9 F (36.6 C)] 97.7 F (36.5 C) (06/08 1600) Pulse Rate:  [62-94] 94 (06/08 1600) Resp:  [15-18] 18 (06/08 1600) BP: (152-168)/(83-87) 168/83 (06/08 1600) SpO2:  [94 %-97 %] 97 % (06/08 1600) Weight:  [93.9 kg] 93.9 kg (06/08 1602)       Weight: 93.9 kg BMI (Calculated): 26.57   INTAKE/OUTPUT:  This shift: No intake/output data recorded.  Last 2 shifts: @IOLAST2SHIFTS @   PHYSICAL EXAM:  Constitutional:  -- Normal body  habitus  -- Awake, alert, and oriented x3  Eyes:  -- Pupils equally round and reactive to light  -- No scleral icterus  Ear, nose, and throat:  -- No jugular venous distension  Pulmonary:  -- No crackles  -- Equal breath sounds bilaterally -- Breathing non-labored at rest Cardiovascular:  -- S1, S2 present  -- No pericardial rubs Gastrointestinal:  -- Abdomen soft, nontender, non-distended, no guarding or rebound tenderness -- Perennial hernia, reduced.  No tenderness on palpation in the right groin.  Abdomen is somewhat depressible.  Nondistended. Musculoskeletal and Integumentary:  -- Wounds: None appreciated -- Extremities: B/L UE and LE FROM, hands and feet warm, no edema  Neurologic:  -- Motor function: intact and  symmetric -- Sensation: intact and symmetric   Labs:     Latest Ref Rng & Units 09/03/2022    4:05 PM 07/13/2017    5:46 AM 07/11/2017    4:42 PM  CBC  WBC 4.0 - 10.5 K/uL 8.2  8.9  10.7   Hemoglobin 13.0 - 17.0 g/dL 16.1  09.6  04.5   Hematocrit 39.0 - 52.0 % 49.0  45.4  48.1   Platelets 150 - 400 K/uL 206  176  195       Latest Ref Rng & Units 09/03/2022    4:05 PM 07/11/2017    4:42 PM 06/27/2017   11:42 AM  CMP  Glucose 70 - 99 mg/dL 409   811   BUN 8 - 23 mg/dL 11   18   Creatinine 9.14 - 1.24 mg/dL 7.82  9.56  2.13   Sodium 135 - 145 mmol/L 136   140   Potassium 3.5 - 5.1 mmol/L 3.8   4.3   Chloride 98 - 111 mmol/L 104   106   CO2 22 - 32 mmol/L 23   26   Calcium 8.9 - 10.3 mg/dL 9.7   9.7   Total Protein 6.5 - 8.1 g/dL 7.4   7.9   Total Bilirubin 0.3 - 1.2 mg/dL 0.7   0.7   Alkaline Phos 38 - 126 U/L 38   47   AST 15 - 41 U/L 28   43   ALT 0 - 44 U/L 16   36     Imaging studies:  EXAM: CT ABDOMEN AND PELVIS WITH CONTRAST   TECHNIQUE: Multidetector CT imaging of the abdomen and pelvis was performed using the standard protocol following bolus administration of intravenous contrast.   RADIATION DOSE REDUCTION: This exam was performed  according to the departmental dose-optimization program which includes automated exposure control, adjustment of the mA and/or kV according to patient size and/or use of iterative reconstruction technique.   CONTRAST:  OMNIPAQUE IOHEXOL 300 MG/ML  SOLN   COMPARISON:  June 27, 2017   FINDINGS: Lower chest: Moderate severity, predominately peripheral bibasilar scarring and/or atelectasis is seen.   Hepatobiliary: No focal liver abnormality is seen. Status post cholecystectomy. No biliary dilatation.   Pancreas: Diffuse pancreatic atrophy is noted. No pancreatic ductal dilatation or surrounding inflammatory changes.   Spleen: Normal in size without focal abnormality.   Adrenals/Urinary Tract: Adrenal glands are unremarkable. Kidneys are normal in size, without renal calculi or hydronephrosis. Focal renal cortical scarring is seen along the upper pole of the right kidney. A 12 mm diameter simple cyst is seen within the anterior aspect of the lower pole of the left kidney. A 16 mm x 9 mm x 10 mm area of soft tissue attenuation is seen along the dependent portion of the urinary bladder lumen (axial CT image 84, CT series 2).   Stomach/Bowel: There is a small hiatal hernia. Appendix appears normal. No evidence of bowel wall thickening, distention, or inflammatory changes. Noninflamed diverticula are seen throughout the large bowel.   Vascular/Lymphatic: Aortic atherosclerosis with 2.4 cm x 2.0 cm aneurysmal dilatation of the left common iliac artery. No enlarged abdominal or pelvic lymph nodes.   Reproductive: The prostate gland is surgically absent.   Other: A 3.8 cm x 3.8 cm right inguinal hernia is seen. This contains a small amount of fluid and a 2.2 cm diameter loop of small bowel which is markedly decompressed upon entry and exit of the inguinal hernia (axial  CT images 78 through 87, CT series 2). The loop of bowel proximal to the inguinal hernia measures 2.4 cm  in diameter (axial CT images 69 through 80, CT series 2).   No abdominopelvic ascites.   Musculoskeletal: Marked severity degenerative changes are seen at the level of L5-S1.   IMPRESSION: 1. Right inguinal hernia containing a loop of small bowel which is markedly decompressed upon entry and exit of the inguinal hernia. Sequelae associated with an early closed loop obstruction cannot be excluded. 2. Small hiatal hernia. 3. Colonic diverticulosis. 4. Evidence of prior cholecystectomy and prostatectomy. 5. 2.4 cm x 2.0 cm aneurysmal dilatation of the left common iliac artery. 6. Aortic atherosclerosis.   Aortic Atherosclerosis (ICD10-I70.0).     Electronically Signed   By: Aram Candela M.D.   On: 09/03/2022 17:39  Assessment/Plan:  89 y.o. male with episode of right inguinal hernia with obstruction, complicated by pertinent comorbidities including hypertension, history of CVA.  Patient with acute episode of right inguinal hernia with early obstruction.  At the moment of my evaluation the hernia was reduced.  There is no pain in the right groin.  There is no bulge or swelling.  Patient endorses that he has been feeling Cerny for a while.  He endorses that the bulge is usually aggravated by bowel movements when he straining.  Otherwise he does not have any significant discomfort in the right groin.  Due to the acute episode of early obstruction that was resolved with reduction of the hernia I offered the patient to be admitted for the hernia repair to avoid early recurrence.  Patient basically Beg to be discharged today and he will like to have the hernia repair as an elective basis.  I think that at this point it is safe to discharge the patient.  He does understand the risk of hernia recurrence with obstruction.  I discussed with patient red flags that will need to come back to the ED for urgent evaluation if he develop another episode of right groin pain.  Otherwise I will contact  him to coordinate elective right inguinal hernia repair.  The daughter was present during the evaluation and she agreed with plan.  She offer her as a contact to coordinate the surgery as outpatient.  We discussed about the open inguinal hernia repair.  Patient with multiple ventral hernia repair.  Last ventral hernia repair was in 2019 and as per op note there was significant amount of adhesions.  For this reason patient will benefit of open inguinal hernia repair.  Patient oriented about the risk including bleeding, infection, recurrence, injury to vas deferens, testicular vessels, among others.  The patient reported he understood and agreed to proceed.  Gae Gallop, MD

## 2022-09-03 NOTE — ED Notes (Addendum)
Patient is being discharged from the Urgent Care and sent to the Emergency Department via private vehicle with daughter . Per Eusebio Friendly, PA, patient is in need of higher level of care due to needing further imaging for abdominal pain. Patient is aware and verbalizes understanding of plan of care.  Vitals:   09/03/22 1351  BP: (!) 152/87  Pulse: 62  Resp: 15  Temp: 97.9 F (36.6 C)  SpO2: 94%

## 2022-09-03 NOTE — Discharge Instructions (Addendum)
-  X-ray shows an ileus and possibly early bowel obstruction.  You need a CT scan and more workup in the emergency department.  Please do not wait until Monday to be seen and go to the emergency department at this time.  You have been advised to follow up immediately in the emergency department for concerning signs.symptoms. If you declined EMS transport, please have a family member take you directly to the ED at this time. Do not delay. Based on concerns about condition, if you do not follow up in th e ED, you may risk poor outcomes including worsening of condition, delayed treatment and potentially life threatening issues. If you have declined to go to the ED at this time, you should call your PCP immediately to set up a follow up appointment.  Go to ED for red flag symptoms, including; fevers you cannot reduce with Tylenol/Motrin, severe headaches, vision changes, numbness/weakness in part of the body, lethargy, confusion, intractable vomiting, severe dehydration, chest pain, breathing difficulty, severe persistent abdominal or pelvic pain, signs of severe infection (increased redness, swelling of an area), feeling faint or passing out, dizziness, etc. You should especially go to the ED for sudden acute worsening of condition if you do not elect to go at this time.

## 2022-09-03 NOTE — ED Provider Notes (Signed)
[facility] Provider Note    Event Date/Time   First MD Initiated Contact with Patient 09/03/22 1615     (approximate)   History   Abdominal Pain   HPI  Alex Malone is a 89 y.o. male  who presents to the emergency department today from urgent care because of concern for possible SBO. The patient went to urgent care today because of lower abdominal pain that started yesterday. Located across the lower abdomen. He says he has history of hernias and has had multiple surgeries for that with the last one being roughly 6 years ago. The patient has had a bowel movement today. No vomiting. No fevers. Urgent care x-ray was concerning for possible SBO.       Physical Exam   Triage Vital Signs: ED Triage Vitals  Enc Vitals Group     BP 09/03/22 1600 (!) 168/83     Pulse Rate 09/03/22 1600 94     Resp 09/03/22 1600 18     Temp 09/03/22 1600 97.7 F (36.5 C)     Temp Source 09/03/22 1600 Oral     SpO2 09/03/22 1600 97 %     Weight 09/03/22 1602 207 lb (93.9 kg)     Height --      Head Circumference --      Peak Flow --      Pain Score 09/03/22 1601 8     Pain Loc --      Pain Edu? --      Excl. in GC? --     Most recent vital signs: Vitals:   09/03/22 1600  BP: (!) 168/83  Pulse: 94  Resp: 18  Temp: 97.7 F (36.5 C)  SpO2: 97%   General: Awake, alert, oriented. CV:  Good peripheral perfusion. Regular rate and rhythm. Resp:  Normal effort. Lungs clear. Abd:  No distention. Non tender. Slightly tympanitic on the left side.    ED Results / Procedures / Treatments   Labs (all labs ordered are listed, but only abnormal results are displayed) Labs Reviewed  COMPREHENSIVE METABOLIC PANEL - Abnormal; Notable for the following components:      Result Value   Glucose, Bld 104 (*)    All other components within normal limits  LIPASE, BLOOD  CBC WITH DIFFERENTIAL/PLATELET     EKG  None   RADIOLOGY I independently interpreted and  visualized the CT abd/pel. My interpretation: right inguinal hernia Radiology interpretation:  IMPRESSION:  1. Right inguinal hernia containing a loop of small bowel which is  markedly decompressed upon entry and exit of the inguinal hernia.  Sequelae associated with an early closed loop obstruction cannot be  excluded.  2. Small hiatal hernia.  3. Colonic diverticulosis.  4. Evidence of prior cholecystectomy and prostatectomy.  5. 2.4 cm x 2.0 cm aneurysmal dilatation of the left common iliac  artery.  6. Aortic atherosclerosis.      PROCEDURES:  Critical Care performed: No   MEDICATIONS ORDERED IN ED: Medications - No data to display   IMPRESSION / MDM / ASSESSMENT AND PLAN / ED COURSE  I reviewed the triage vital signs and the nursing notes.                              Differential diagnosis includes, but is not limited to, hernia, SBO, diverticulitis  Patient's presentation is most consistent with acute presentation with potential threat to life or  bodily function.   The patient is on the cardiac monitor to evaluate for evidence of arrhythmia and/or significant heart rate changes.  Patient presented to the emergency department today because of concern for abdominal pain and possible SBO seen on outpatient x-ray. At the time of my exam patient has minimal lower abdominal tenderness. CT scan was concerning for right inguinal hernia with possible closed loop obstruction. On reexam I could not feel a hernia and patient did not have significant tenderness over that area. Discussed with Dr. Maia Plan with surgery who evaluated the patient. At this time he feels hernia is reduced. Offered admission and surgery however patient declined and will plan on follow up with Dr. Maia Plan to schedule surgery as an outpatient.       FINAL CLINICAL IMPRESSION(S) / ED DIAGNOSES   Final diagnoses:  Unilateral inguinal hernia without obstruction or gangrene, recurrence not specified     Note:  This document was prepared using Dragon voice recognition software and may include unintentional dictation errors.    Phineas Semen, MD 09/03/22 [phone]

## 2022-09-03 NOTE — Discharge Instructions (Signed)
Please return for any increasing pain, swelling, lack of bowel movements or passing gas, or any other new or concerning symptoms.

## 2022-09-03 NOTE — ED Triage Notes (Addendum)
BIB family from [facility] for RLQ/inguinal area pain, and concern for ileus. Onset yesterday. H/o similar. Mentions constipation, but last BM was this am. H/o hernia surgeries. Pain worse with having BM. Denies NVD, fever. Alert, NAD, calm, interactive. Xray results and urinalysis results from [facility] earlier today viewable in system.

## 2022-09-03 NOTE — ED Triage Notes (Signed)
Patient c/o right LQ pain that started a week ago.  Patient reports pain when having a bowel movement.  Patient denies N/V/D.  Patient denies fevers. Patient reports history of abdominal hernia.

## 2022-09-05 ENCOUNTER — Ambulatory Visit: Payer: Self-pay | Admitting: General Surgery

## 2022-09-05 DIAGNOSIS — I10 Essential (primary) hypertension: Secondary | ICD-10-CM | POA: Diagnosis not present

## 2022-09-05 DIAGNOSIS — E782 Mixed hyperlipidemia: Secondary | ICD-10-CM | POA: Diagnosis not present

## 2022-09-05 DIAGNOSIS — Z0000 Encounter for general adult medical examination without abnormal findings: Secondary | ICD-10-CM | POA: Diagnosis not present

## 2022-09-05 DIAGNOSIS — R7302 Impaired glucose tolerance (oral): Secondary | ICD-10-CM | POA: Diagnosis not present

## 2022-09-05 DIAGNOSIS — F320 Major depressive disorder, single episode, mild: Secondary | ICD-10-CM | POA: Diagnosis not present

## 2022-09-05 DIAGNOSIS — Z1331 Encounter for screening for depression: Secondary | ICD-10-CM | POA: Diagnosis not present

## 2022-09-05 NOTE — H&P (View-Only) (Signed)
SURGICAL CONSULTATION NOTE    HISTORY OF PRESENT ILLNESS (HPI):  89 y.o. male presented to [facility] ED for evaluation of lower abdominal pain. Patient reports he started having right lower quadrant pain about a week ago.  He endorses that the pain intensified the last few days.  Pain localized to the right groin.  Pain radiates to the right leg.  He denies any nausea or vomiting.   Patient initially went to the urgent care and he had an abdominal x-ray concerning for ileus versus bowel obstruction.  He was sent to the ED for further evaluation.  At the ED he had a CT scan of the abdomen and pelvis that showed irregular hernia with obstruction.  Labs shows no leukocytosis.  No significant electrolyte disturbance.   Hernia was able to be reduced.  Patient denies any abdominal pain.  He denies any nausea or vomiting.   Surgery is consulted by Dr. Derrill Kay in this context for evaluation and management of inguinal hernia with obstruction.   PAST MEDICAL HISTORY (PMH):      Past Medical History:  Diagnosis Date   Anxiety     History of lacunar cerebrovascular accident (CVA)     History of prostate cancer     History of subdural hematoma     HOH (hard of hearing)     Hyperlipidemia     Hyperlipidemia, unspecified 11/25/2012   Hypertension     Hyperuricemia        PAST SURGICAL HISTORY (PSH):       Past Surgical History:  Procedure Laterality Date   BACK SURGERY       CATARACT EXTRACTION W/PHACO Left 06/22/2020    Procedure: CATARACT EXTRACTION PHACO AND INTRAOCULAR LENS PLACEMENT (IOC) LEFT AHMED TUBE SHUNT W/TUTOPLAST 3.04 00:26.7;  Surgeon: Nevada Crane, MD;  Location: [facility];  Service: Ophthalmology;  Laterality: Left;  90 minute case per Lennox Laity   CHOLECYSTECTOMY       EVALUATION UNDER ANESTHESIA WITH HEMORRHOIDECTOMY       HERNIA REPAIR       INSERTION OF MESH N/A 07/11/2017    Procedure: INSERTION OF MESH;  Surgeon: Lattie Haw, MD;  Location: [facility];  Service:  General;  Laterality: N/A;   PERINEAL PROSTATECTOMY       subdermal hematoma       VENTRAL HERNIA REPAIR N/A 07/11/2017    Procedure: HERNIA REPAIR RECURRENT VENTRAL ADULT;  Surgeon: Lattie Haw, MD;  Location: [facility];  Service: General;  Laterality: N/A;      MEDICATIONS:         Prior to Admission medications   Medication Sig Start Date End Date Taking? Authorizing Provider  traMADol (ULTRAM) 50 MG tablet Take 1 tablet (50 mg total) by mouth every 6 (six) hours as needed for severe pain. 09/03/22 09/03/23 Yes Phineas Semen, MD  acetaminophen (TYLENOL) 325 MG tablet Take 650 mg by mouth every 6 (six) hours as needed.       [provider]  ASPIRIN 81 PO Take by mouth daily.       [provider]  brimonidine (ALPHAGAN) 0.2 % ophthalmic solution Place 1 drop into both eyes 2 (two) times daily. 07/25/16     [provider]  diazepam (VALIUM) 2 MG tablet Take 2 mg by mouth every 6 (six) hours as needed for anxiety.       [provider]  dorzolamide (TRUSOPT) 2 % ophthalmic solution 1 drop.  [provider]  fluticasone (FLONASE) 50 MCG/ACT nasal spray Place into both nostrils daily.       [provider]  latanoprost (XALATAN) 0.005 % ophthalmic solution 1 drop at bedtime.       [provider]  lisinopril (PRINIVIL,ZESTRIL) 10 MG tablet Take 10 mg by mouth daily.       [provider]  Meclizine HCl 25 MG CHEW Chew 1 tablet by mouth daily. 10/04/17     [provider]  senna (SENOKOT) 8.6 MG tablet Take 1 tablet by mouth daily.       [provider]  simvastatin (ZOCOR) 20 MG tablet Take 20 mg by mouth at bedtime.        [provider]  timolol (BETIMOL) 0.5 % ophthalmic solution 1 drop.       [provider]      ALLERGIES:  No Known Allergies    SOCIAL HISTORY:  Social History         Socioeconomic History   Marital status: Married      Spouse name: Not on file    Number of children: Not on file   Years of education: Not on file   Highest education level: Not on file  Occupational History   Not on file  Tobacco Use   Smoking status: Never   Smokeless tobacco: Never  Vaping Use   Vaping Use: Never used  Substance and Sexual Activity   Alcohol use: No      Comment: rare   Drug use: No   Sexual activity: Not on file  Other Topics Concern   Not on file  Social History Narrative   Not on file    Social Determinants of Health    Financial Resource Strain: Not on file  Food Insecurity: Not on file  Transportation Needs: Not on file  Physical Activity: Not on file  Stress: Not on file  Social Connections: Not on file  Intimate Partner Violence: Not on file        FAMILY HISTORY:       Family History  Problem Relation Age of Onset   Kidney disease Mother     Cancer Father        REVIEW OF SYSTEMS:  Constitutional: denies weight loss, fever, chills, or sweats  Eyes: denies any other vision changes, history of eye injury  ENT: denies sore throat, hearing problems  Respiratory: denies shortness of breath, wheezing  Cardiovascular: denies chest pain, palpitations  Gastrointestinal: Positive abdominal pain, negative nausea and vomiting Genitourinary: denies burning with urination or urinary frequency Musculoskeletal: denies any other joint pains or cramps  Skin: denies any other rashes or skin discolorations  Neurological: denies any other headache, dizziness, weakness  Psychiatric: denies any other depression, anxiety    All other review of systems were negative    VITAL SIGNS:  Temp:  [97.7 F (36.5 C)-97.9 F (36.6 C)] 97.7 F (36.5 C) (06/08 1600) Pulse Rate:  [62-94] 94 (06/08 1600) Resp:  [15-18] 18 (06/08 1600) BP: (152-168)/(83-87) 168/83 (06/08 1600) SpO2:  [94 %-97 %] 97 % (06/08 1600) Weight:  [93.9 kg] 93.9 kg (06/08 1602)       Weight: 93.9 kg BMI (Calculated): 26.57    INTAKE/OUTPUT:  This shift: No  intake/output data recorded.  Last 2 shifts: @IOLAST2SHIFTS @    PHYSICAL EXAM:  Constitutional:  -- Normal body habitus  -- Awake, alert, and oriented x3  Eyes:  -- Pupils equally round and reactive  to light  -- No scleral icterus  Ear, nose, and throat:  -- No jugular venous distension  Pulmonary:  -- No crackles  -- Equal breath sounds bilaterally -- Breathing non-labored at rest Cardiovascular:  -- S1, S2 present  -- No pericardial rubs Gastrointestinal:  -- Abdomen soft, nontender, non-distended, no guarding or rebound tenderness -- Perennial hernia, reduced.  No tenderness on palpation in the right groin.  Abdomen is somewhat depressible.  Nondistended. Musculoskeletal and Integumentary:  -- Wounds: None appreciated -- Extremities: B/L UE and LE FROM, hands and feet warm, no edema  Neurologic:  -- Motor function: intact and symmetric -- Sensation: intact and symmetric     Labs:      Latest Ref Rng & Units 09/03/2022    4:05 PM 07/13/2017    5:46 AM 07/11/2017    4:42 PM  CBC  WBC 4.0 - 10.5 K/uL 8.2  8.9  10.7   Hemoglobin 13.0 - 17.0 g/dL 34.7  42.5  95.6   Hematocrit 39.0 - 52.0 % 49.0  45.4  48.1   Platelets 150 - 400 K/uL 206  176  195         Latest Ref Rng & Units 09/03/2022    4:05 PM 07/11/2017    4:42 PM 06/27/2017   11:42 AM  CMP  Glucose 70 - 99 mg/dL 387    564   BUN 8 - 23 mg/dL 11    18   Creatinine 3.32 - 1.24 mg/dL 9.51  8.84  1.66   Sodium 135 - 145 mmol/L 136    140   Potassium 3.5 - 5.1 mmol/L 3.8    4.3   Chloride 98 - 111 mmol/L 104    106   CO2 22 - 32 mmol/L 23    26   Calcium 8.9 - 10.3 mg/dL 9.7    9.7   Total Protein 6.5 - 8.1 g/dL 7.4    7.9   Total Bilirubin 0.3 - 1.2 mg/dL 0.7    0.7   Alkaline Phos 38 - 126 U/L 38    47   AST 15 - 41 U/L 28    43   ALT 0 - 44 U/L 16    36       Imaging studies:  EXAM: CT ABDOMEN AND PELVIS WITH CONTRAST   TECHNIQUE: Multidetector CT imaging of the abdomen and pelvis was performed using the  standard protocol following bolus administration of intravenous contrast.   RADIATION DOSE REDUCTION: This exam was performed according to the departmental dose-optimization program which includes automated exposure control, adjustment of the mA and/or kV according to patient size and/or use of iterative reconstruction technique.   CONTRAST:  OMNIPAQUE IOHEXOL 300 MG/ML  SOLN   COMPARISON:  June 27, 2017   FINDINGS: Lower chest: Moderate severity, predominately peripheral bibasilar scarring and/or atelectasis is seen.   Hepatobiliary: No focal liver abnormality is seen. Status post cholecystectomy. No biliary dilatation.   Pancreas: Diffuse pancreatic atrophy is noted. No pancreatic ductal dilatation or surrounding inflammatory changes.   Spleen: Normal in size without focal abnormality.   Adrenals/Urinary Tract: Adrenal glands are unremarkable. Kidneys are normal in size, without renal calculi or hydronephrosis. Focal renal cortical scarring is seen along the upper pole of the right kidney. A 12 mm diameter simple cyst is seen within the anterior aspect of the lower pole of the left kidney. A 16 mm x 9 mm x 10 mm area of soft tissue attenuation  is seen along the dependent portion of the urinary bladder lumen (axial CT image 84, CT series 2).   Stomach/Bowel: There is a small hiatal hernia. Appendix appears normal. No evidence of bowel wall thickening, distention, or inflammatory changes. Noninflamed diverticula are seen throughout the large bowel.   Vascular/Lymphatic: Aortic atherosclerosis with 2.4 cm x 2.0 cm aneurysmal dilatation of the left common iliac artery. No enlarged abdominal or pelvic lymph nodes.   Reproductive: The prostate gland is surgically absent.   Other: A 3.8 cm x 3.8 cm right inguinal hernia is seen. This contains a small amount of fluid and a 2.2 cm diameter loop of small bowel which is markedly decompressed upon entry and exit of  the inguinal hernia (axial CT images 78 through 87, CT series 2). The loop of bowel proximal to the inguinal hernia measures 2.4 cm in diameter (axial CT images 69 through 80, CT series 2).   No abdominopelvic ascites.   Musculoskeletal: Marked severity degenerative changes are seen at the level of L5-S1.   IMPRESSION: 1. Right inguinal hernia containing a loop of small bowel which is markedly decompressed upon entry and exit of the inguinal hernia. Sequelae associated with an early closed loop obstruction cannot be excluded. 2. Small hiatal hernia. 3. Colonic diverticulosis. 4. Evidence of prior cholecystectomy and prostatectomy. 5. 2.4 cm x 2.0 cm aneurysmal dilatation of the left common iliac artery. 6. Aortic atherosclerosis.   Aortic Atherosclerosis (ICD10-I70.0).     Electronically Signed   By: Aram Candela M.D.   On: 09/03/2022 17:39   Assessment/Plan:  89 y.o. male with episode of right inguinal hernia with obstruction, complicated by pertinent comorbidities including hypertension, history of CVA.   Patient with acute episode of right inguinal hernia with early obstruction.  At the moment of my evaluation the hernia was reduced.  There is no pain in the right groin.  There is no bulge or swelling.  Patient endorses that he has been feeling Cerny for a while.  He endorses that the bulge is usually aggravated by bowel movements when he straining.  Otherwise he does not have any significant discomfort in the right groin.  Due to the acute episode of early obstruction that was resolved with reduction of the hernia I offered the patient to be admitted for the hernia repair to avoid early recurrence.  Patient basically Beg to be discharged today and he will like to have the hernia repair as an elective basis.  I think that at this point it is safe to discharge the patient.  He does understand the risk of hernia recurrence with obstruction.  I discussed with patient red flags  that will need to come back to the ED for urgent evaluation if he develop another episode of right groin pain.  Otherwise I will contact him to coordinate elective right inguinal hernia repair.  The daughter was present during the evaluation and she agreed with plan.  She offer her as a contact to coordinate the surgery as outpatient.   We discussed about the open inguinal hernia repair.  Patient with multiple ventral hernia repair.  Last ventral hernia repair was in 2019 and as per op note there was significant amount of adhesions.  For this reason patient will benefit of open inguinal hernia repair.  Patient oriented about the risk including bleeding, infection, recurrence, injury to vas deferens, testicular vessels, among others.  The patient reported he understood and agreed to proceed.   Gae Gallop, MD

## 2022-09-05 NOTE — H&P (Signed)
SURGICAL CONSULTATION NOTE    HISTORY OF PRESENT ILLNESS (HPI):  89 y.o. male presented to [facility] ED for evaluation of lower abdominal pain. Patient reports he started having right lower quadrant pain about a week ago.  He endorses that the pain intensified the last few days.  Pain localized to the right groin.  Pain radiates to the right leg.  He denies any nausea or vomiting.   Patient initially went to the urgent care and he had an abdominal x-ray concerning for ileus versus bowel obstruction.  He was sent to the ED for further evaluation.  At the ED he had a CT scan of the abdomen and pelvis that showed irregular hernia with obstruction.  Labs shows no leukocytosis.  No significant electrolyte disturbance.   Hernia was able to be reduced.  Patient denies any abdominal pain.  He denies any nausea or vomiting.   Surgery is consulted by Dr. Goodman in this context for evaluation and management of inguinal hernia with obstruction.   PAST MEDICAL HISTORY (PMH):      Past Medical History:  Diagnosis Date   Anxiety     History of lacunar cerebrovascular accident (CVA)     History of prostate cancer     History of subdural hematoma     HOH (hard of hearing)     Hyperlipidemia     Hyperlipidemia, unspecified 11/25/2012   Hypertension     Hyperuricemia        PAST SURGICAL HISTORY (PSH):       Past Surgical History:  Procedure Laterality Date   BACK SURGERY       CATARACT EXTRACTION W/PHACO Left 06/22/2020    Procedure: CATARACT EXTRACTION PHACO AND INTRAOCULAR LENS PLACEMENT (IOC) LEFT AHMED TUBE SHUNT W/TUTOPLAST 3.04 00:26.7;  Surgeon: King, Bradley Mark, MD;  Location: [facility];  Service: Ophthalmology;  Laterality: Left;  90 minute case per [place]   CHOLECYSTECTOMY       EVALUATION UNDER ANESTHESIA WITH HEMORRHOIDECTOMY       HERNIA REPAIR       INSERTION OF MESH N/A 07/11/2017    Procedure: INSERTION OF MESH;  Surgeon: Cooper, Richard E, MD;  Location: [facility];  Service:  General;  Laterality: N/A;   PERINEAL PROSTATECTOMY       subdermal hematoma       VENTRAL HERNIA REPAIR N/A 07/11/2017    Procedure: HERNIA REPAIR RECURRENT VENTRAL ADULT;  Surgeon: Cooper, Richard E, MD;  Location: [facility] OR[facility];  Service: General;  Laterality: N/A;      MEDICATIONS:         Prior to Admission medications   Medication Sig Start Date End Date Taking? Authorizing Provider  traMADol (ULTRAM) 50 MG tablet Take 1 tablet (50 mg total) by mouth every 6 (six) hours as needed for severe pain. 09/03/22 09/03/23 Yes Goodman, Graydon, MD  acetaminophen (TYLENOL) 325 MG tablet Take 650 mg by mouth every 6 (six) hours as needed.       [provider]  ASPIRIN 81 PO Take by mouth daily.       [provider]  brimonidine (ALPHAGAN) 0.2 % ophthalmic solution Place 1 drop into both eyes 2 (two) times daily. 07/25/16     [provider]  diazepam (VALIUM) 2 MG tablet Take 2 mg by mouth every 6 (six) hours as needed for anxiety.       [provider]  dorzolamide (TRUSOPT) 2 % ophthalmic solution 1 drop.         [provider]  fluticasone (FLONASE) 50 MCG/ACT nasal spray Place into both nostrils daily.       [provider]  latanoprost (XALATAN) 0.005 % ophthalmic solution 1 drop at bedtime.       [provider]  lisinopril (PRINIVIL,ZESTRIL) 10 MG tablet Take 10 mg by mouth daily.       [provider]  Meclizine HCl 25 MG CHEW Chew 1 tablet by mouth daily. 10/04/17     [provider]  senna (SENOKOT) 8.6 MG tablet Take 1 tablet by mouth daily.       [provider]  simvastatin (ZOCOR) 20 MG tablet Take 20 mg by mouth at bedtime.        [provider]  timolol (BETIMOL) 0.5 % ophthalmic solution 1 drop.       [provider]      ALLERGIES:  No Known Allergies    SOCIAL HISTORY:  Social History         Socioeconomic History   Marital status: Married      Spouse name: Not on file    Number of children: Not on file   Years of education: Not on file   Highest education level: Not on file  Occupational History   Not on file  Tobacco Use   Smoking status: Never   Smokeless tobacco: Never  Vaping Use   Vaping Use: Never used  Substance and Sexual Activity   Alcohol use: No      Comment: rare   Drug use: No   Sexual activity: Not on file  Other Topics Concern   Not on file  Social History Narrative   Not on file    Social Determinants of Health    Financial Resource Strain: Not on file  Food Insecurity: Not on file  Transportation Needs: Not on file  Physical Activity: Not on file  Stress: Not on file  Social Connections: Not on file  Intimate Partner Violence: Not on file        FAMILY HISTORY:       Family History  Problem Relation Age of Onset   Kidney disease Mother     Cancer Father        REVIEW OF SYSTEMS:  Constitutional: denies weight loss, fever, chills, or sweats  Eyes: denies any other vision changes, history of eye injury  ENT: denies sore throat, hearing problems  Respiratory: denies shortness of breath, wheezing  Cardiovascular: denies chest pain, palpitations  Gastrointestinal: Positive abdominal pain, negative nausea and vomiting Genitourinary: denies burning with urination or urinary frequency Musculoskeletal: denies any other joint pains or cramps  Skin: denies any other rashes or skin discolorations  Neurological: denies any other headache, dizziness, weakness  Psychiatric: denies any other depression, anxiety    All other review of systems were negative    VITAL SIGNS:  Temp:  [97.7 F (36.5 C)-97.9 F (36.6 C)] 97.7 F (36.5 C) (06/08 1600) Pulse Rate:  [62-94] 94 (06/08 1600) Resp:  [15-18] 18 (06/08 1600) BP: (152-168)/(83-87) 168/83 (06/08 1600) SpO2:  [94 %-97 %] 97 % (06/08 1600) Weight:  [93.9 kg] 93.9 kg (06/08 1602)       Weight: 93.9 kg BMI (Calculated): 26.57    INTAKE/OUTPUT:  This shift: No  intake/output data recorded.  Last 2 shifts: @IOLAST2SHIFTS@    PHYSICAL EXAM:  Constitutional:  -- Normal body habitus  -- Awake, alert, and oriented x3  Eyes:  -- Pupils equally round and reactive   to light  -- No scleral icterus  Ear, nose, and throat:  -- No jugular venous distension  Pulmonary:  -- No crackles  -- Equal breath sounds bilaterally -- Breathing non-labored at rest Cardiovascular:  -- S1, S2 present  -- No pericardial rubs Gastrointestinal:  -- Abdomen soft, nontender, non-distended, no guarding or rebound tenderness -- Perennial hernia, reduced.  No tenderness on palpation in the right groin.  Abdomen is somewhat depressible.  Nondistended. Musculoskeletal and Integumentary:  -- Wounds: None appreciated -- Extremities: B/L UE and LE FROM, hands and feet warm, no edema  Neurologic:  -- Motor function: intact and symmetric -- Sensation: intact and symmetric     Labs:      Latest Ref Rng & Units 09/03/2022    4:05 PM 07/13/2017    5:46 AM 07/11/2017    4:42 PM  CBC  WBC 4.0 - 10.5 K/uL 8.2  8.9  10.7   Hemoglobin 13.0 - 17.0 g/dL 15.6  15.4  16.5   Hematocrit 39.0 - 52.0 % 49.0  45.4  48.1   Platelets 150 - 400 K/uL 206  176  195         Latest Ref Rng & Units 09/03/2022    4:05 PM 07/11/2017    4:42 PM 06/27/2017   11:42 AM  CMP  Glucose 70 - 99 mg/dL 104    135   BUN 8 - 23 mg/dL 11    18   Creatinine 0.61 - 1.24 mg/dL 1.11  0.92  1.13   Sodium 135 - 145 mmol/L 136    140   Potassium 3.5 - 5.1 mmol/L 3.8    4.3   Chloride 98 - 111 mmol/L 104    106   CO2 22 - 32 mmol/L 23    26   Calcium 8.9 - 10.3 mg/dL 9.7    9.7   Total Protein 6.5 - 8.1 g/dL 7.4    7.9   Total Bilirubin 0.3 - 1.2 mg/dL 0.7    0.7   Alkaline Phos 38 - 126 U/L 38    47   AST 15 - 41 U/L 28    43   ALT 0 - 44 U/L 16    36       Imaging studies:  EXAM: CT ABDOMEN AND PELVIS WITH CONTRAST   TECHNIQUE: Multidetector CT imaging of the abdomen and pelvis was performed using the  standard protocol following bolus administration of intravenous contrast.   RADIATION DOSE REDUCTION: This exam was performed according to the departmental dose-optimization program which includes automated exposure control, adjustment of the mA and/or kV according to patient size and/or use of iterative reconstruction technique.   CONTRAST:  100mL OMNIPAQUE IOHEXOL 300 MG/ML  SOLN   COMPARISON:  June 27, 2017   FINDINGS: Lower chest: Moderate severity, predominately peripheral bibasilar scarring and/or atelectasis is seen.   Hepatobiliary: No focal liver abnormality is seen. Status post cholecystectomy. No biliary dilatation.   Pancreas: Diffuse pancreatic atrophy is noted. No pancreatic ductal dilatation or surrounding inflammatory changes.   Spleen: Normal in size without focal abnormality.   Adrenals/Urinary Tract: Adrenal glands are unremarkable. Kidneys are normal in size, without renal calculi or hydronephrosis. Focal renal cortical scarring is seen along the upper pole of the right kidney. A 12 mm diameter simple cyst is seen within the anterior aspect of the lower pole of the left kidney. A 16 mm x 9 mm x 10 mm area of soft tissue attenuation   is seen along the dependent portion of the urinary bladder lumen (axial CT image 84, CT series 2).   Stomach/Bowel: There is a small hiatal hernia. Appendix appears normal. No evidence of bowel wall thickening, distention, or inflammatory changes. Noninflamed diverticula are seen throughout the large bowel.   Vascular/Lymphatic: Aortic atherosclerosis with 2.4 cm x 2.0 cm aneurysmal dilatation of the left common iliac artery. No enlarged abdominal or pelvic lymph nodes.   Reproductive: The prostate gland is surgically absent.   Other: A 3.8 cm x 3.8 cm right inguinal hernia is seen. This contains a small amount of fluid and a 2.2 cm diameter loop of small bowel which is markedly decompressed upon entry and exit of  the inguinal hernia (axial CT images 78 through 87, CT series 2). The loop of bowel proximal to the inguinal hernia measures 2.4 cm in diameter (axial CT images 69 through 80, CT series 2).   No abdominopelvic ascites.   Musculoskeletal: Marked severity degenerative changes are seen at the level of L5-S1.   IMPRESSION: 1. Right inguinal hernia containing a loop of small bowel which is markedly decompressed upon entry and exit of the inguinal hernia. Sequelae associated with an early closed loop obstruction cannot be excluded. 2. Small hiatal hernia. 3. Colonic diverticulosis. 4. Evidence of prior cholecystectomy and prostatectomy. 5. 2.4 cm x 2.0 cm aneurysmal dilatation of the left common iliac artery. 6. Aortic atherosclerosis.   Aortic Atherosclerosis (ICD10-I70.0).     Electronically Signed   By: Thaddeus  Houston M.D.   On: 09/03/2022 17:39   Assessment/Plan:  89 y.o. male with episode of right inguinal hernia with obstruction, complicated by pertinent comorbidities including hypertension, history of CVA.   Patient with acute episode of right inguinal hernia with early obstruction.  At the moment of my evaluation the hernia was reduced.  There is no pain in the right groin.  There is no bulge or swelling.  Patient endorses that he has been feeling Cerny for a while.  He endorses that the bulge is usually aggravated by bowel movements when he straining.  Otherwise he does not have any significant discomfort in the right groin.  Due to the acute episode of early obstruction that was resolved with reduction of the hernia I offered the patient to be admitted for the hernia repair to avoid early recurrence.  Patient basically Beg to be discharged today and he will like to have the hernia repair as an elective basis.  I think that at this point it is safe to discharge the patient.  He does understand the risk of hernia recurrence with obstruction.  I discussed with patient red flags  that will need to come back to the ED for urgent evaluation if he develop another episode of right groin pain.  Otherwise I will contact him to coordinate elective right inguinal hernia repair.  The daughter was present during the evaluation and she agreed with plan.  She offer her as a contact to coordinate the surgery as outpatient.   We discussed about the open inguinal hernia repair.  Patient with multiple ventral hernia repair.  Last ventral hernia repair was in 2019 and as per op note there was significant amount of adhesions.  For this reason patient will benefit of open inguinal hernia repair.  Patient oriented about the risk including bleeding, infection, recurrence, injury to vas deferens, testicular vessels, among others.  The patient reported he understood and agreed to proceed.   Calandra Madura Cintrn-Daz, MD  

## 2022-09-06 ENCOUNTER — Encounter
Admission: RE | Admit: 2022-09-06 | Discharge: 2022-09-06 | Disposition: A | Discharge status: Home / Self Care | Payer: Medicare HMO | Source: Ambulatory Visit | Attending: General Surgery | Admitting: General Surgery

## 2022-09-06 ENCOUNTER — Other Ambulatory Visit: Payer: Self-pay

## 2022-09-06 VITALS — Ht 74.0 in | Wt 181.0 lb

## 2022-09-06 DIAGNOSIS — I10 Essential (primary) hypertension: Secondary | ICD-10-CM

## 2022-09-06 NOTE — Patient Instructions (Addendum)
Your procedure is scheduled on: Monday September 12, 2022. Report to the Registration Desk on the 1st floor of the [facility]. To find out your arrival time, please call [phone] between 1PM - 3PM on: Friday September 09, 2022 If your arrival time is 6:00 am, do not arrive before that time as the [facility] Mall entrance doors do not open until 6:00 am.  REMEMBER: Instructions that are not followed completely may result in serious medical risk, up to and including death; or upon the discretion of your surgeon and anesthesiologist your surgery may need to be rescheduled.  Do not eat food or drink fluids after midnight the night before surgery.  No gum chewing or hard candies.   One week prior to surgery: Stop Anti-inflammatories (NSAIDS) such as Advil, Aleve, Ibuprofen, Motrin, Naproxen, Naprosyn and Aspirin based products such as Excedrin, Goody's Powder, BC Powder. Stop ANY OVER THE COUNTER supplements until after surgery. You may however, continue to take Tylenol if needed for pain up until the day of surgery.  Continue taking all prescribed medications with the exception of the following:   Follow recommendations from Cardiologist or PCP regarding stopping blood thinners.  TAKE ONLY THESE MEDICATIONS THE MORNING OF SURGERY WITH A SIP OF WATER:  timolol (BETIMOL) 0.5 % ophthalmic solution  dorzolamide (TRUSOPT) 2 % ophthalmic solution    No Alcohol for 24 hours before or after surgery.  No Smoking including e-cigarettes for 24 hours before surgery.  No chewable tobacco products for at least 6 hours before surgery.  No nicotine patches on the day of surgery.  Do not use any "recreational" drugs for at least a week (preferably 2 weeks) before your surgery.  Please be advised that the combination of cocaine and anesthesia may have negative outcomes, up to and including death. If you test positive for cocaine, your surgery will be cancelled.  On the morning of surgery brush your teeth  with toothpaste and water, you may rinse your mouth with mouthwash if you wish. Do not swallow any toothpaste or mouthwash.  Use CHG Soap or wipes as directed on instruction sheet.  Do not wear jewelry, make-up, hairpins, clips or nail polish.  Do not wear lotions, powders, or perfumes.   Do not shave body hair from the neck down 48 hours before surgery.  Contact lenses, hearing aids and dentures may not be worn into surgery.  Do not bring valuables to the hospital. [facility] is not responsible for any missing/lost belongings or valuables.   Notify your doctor if there is any change in your medical condition (cold, fever, infection).  Wear comfortable clothing (specific to your surgery type) to the hospital.  After surgery, you can help prevent lung complications by doing breathing exercises.  Take deep breaths and cough every 1-2 hours. Your doctor may order a device called an Incentive Spirometer to help you take deep breaths. When coughing or sneezing, hold a pillow firmly against your incision with both hands. This is called "splinting." Doing this helps protect your incision. It also decreases belly discomfort.  If you are being admitted to the hospital overnight, leave your suitcase in the car. After surgery it may be brought to your room.  In case of increased patient census, it may be necessary for you, the patient, to continue your postoperative care in the Same Day Surgery department.  If you are being discharged the day of surgery, you will not be allowed to drive home. You will need a responsible individual  to drive you home and stay with you for 24 hours after surgery.   If you are taking public transportation, you will need to have a responsible individual with you.  Please call the Pre-admissions Testing Dept. at [phone] if you have any questions about these instructions.  Surgery Visitation Policy:  Patients having surgery or a procedure may have two  visitors.  Children under the age of 69 must have an adult with them who is not the patient.  Inpatient Visitation:    Visiting hours are 7 a.m. to 8 p.m. Up to four visitors are allowed at one time in a patient room. The visitors may rotate out with other people during the day.  One visitor age 61 or older may stay with the patient overnight and must be in the room by 8 p.m.    Preparing for Surgery with CHLORHEXIDINE GLUCONATE (CHG) Soap  Chlorhexidine Gluconate (CHG) Soap  o An antiseptic cleaner that kills germs and bonds with the skin to continue killing germs even after washing  o Used for showering the night before surgery and morning of surgery  Before surgery, you can play an important role by reducing the number of germs on your skin.  CHG (Chlorhexidine gluconate) soap is an antiseptic cleanser which kills germs and bonds with the skin to continue killing germs even after washing.  Please do not use if you have an allergy to CHG or antibacterial soaps. If your skin becomes reddened/irritated stop using the CHG.  1. Shower the NIGHT BEFORE SURGERY and the MORNING OF SURGERY with CHG soap.  2. If you choose to wash your hair, wash your hair first as usual with your normal shampoo.  3. After shampooing, rinse your hair and body thoroughly to remove the shampoo.  4. Use CHG as you would any other liquid soap. You can apply CHG directly to the skin and wash gently with a scrungie or a clean washcloth.  5. Apply the CHG soap to your body only from the neck down. Do not use on open wounds or open sores. Avoid contact with your eyes, ears, mouth, and genitals (private parts). Wash face and genitals (private parts) with your normal soap.  6. Wash thoroughly, paying special attention to the area where your surgery will be performed.  7. Thoroughly rinse your body with warm water.  8. Do not shower/wash with your normal soap after using and rinsing off the CHG soap.  9. Pat  yourself dry with a clean towel.  10. Wear clean pajamas to bed the night before surgery.  12. Place clean sheets on your bed the night of your first shower and do not sleep with pets.  13. Shower again with the CHG soap on the day of surgery prior to arriving at the hospital.  14. Do not apply any deodorants/lotions/powders.  15. Please wear clean clothes to the hospital.

## 2022-09-07 ENCOUNTER — Other Ambulatory Visit: Payer: Medicare HMO

## 2022-09-08 ENCOUNTER — Encounter
Admission: RE | Admit: 2022-09-08 | Discharge: 2022-09-08 | Disposition: A | Discharge status: Home / Self Care | Payer: Medicare HMO | Source: Ambulatory Visit | Attending: General Surgery | Admitting: General Surgery

## 2022-09-08 ENCOUNTER — Telehealth: Payer: Self-pay

## 2022-09-08 DIAGNOSIS — Z01810 Encounter for preprocedural cardiovascular examination: Secondary | ICD-10-CM | POA: Diagnosis not present

## 2022-09-08 DIAGNOSIS — I10 Essential (primary) hypertension: Secondary | ICD-10-CM | POA: Diagnosis not present

## 2022-09-08 NOTE — Telephone Encounter (Signed)
Transition Care Management Unsuccessful Follow-up Telephone Call  Date of discharge and from where:  [facility] 6/8  Attempts:  1st Attempt  Reason for unsuccessful TCM follow-up call:  Left voice message   [facility] Guide, [facility] [phone] [address] [place], [place], [place] [address] Phone: [phone] Email: [email or web address]

## 2022-09-09 ENCOUNTER — Telehealth: Payer: Self-pay

## 2022-09-09 NOTE — Telephone Encounter (Signed)
Transition Care Management Unsuccessful Follow-up Telephone Call  Date of discharge and from where:  [facility] 6/8  Attempts:  2nd Attempt  Reason for unsuccessful TCM follow-up call:  Left voice message   Kaitlinn Iversen [facility] Guide, [place] [phone] [address], [place], [place] [address] Phone: [phone] Email: [email or web address]       

## 2022-09-11 MED ORDER — FAMOTIDINE 20 MG PO TABS
20.0000 mg | ORAL_TABLET | Freq: Once | ORAL | Status: AC
  Administered 2022-09-12: 20 mg via ORAL

## 2022-09-11 MED ORDER — CEFAZOLIN SODIUM-DEXTROSE 2-4 GM/100ML-% IV SOLN
2.0000 g | INTRAVENOUS | Status: AC
  Administered 2022-09-12: 2 g via INTRAVENOUS

## 2022-09-11 MED ORDER — CHLORHEXIDINE GLUCONATE 0.12 % MT SOLN
15.0000 mL | Freq: Once | OROMUCOSAL | Status: AC
  Administered 2022-09-12: 15 mL via OROMUCOSAL

## 2022-09-11 MED ORDER — ORAL CARE MOUTH RINSE: 15.0000 mL | Freq: Once | OROMUCOSAL | Status: AC

## 2022-09-11 MED ORDER — LACTATED RINGERS IV SOLN: INTRAVENOUS | Status: DC

## 2022-09-12 ENCOUNTER — Other Ambulatory Visit: Payer: Self-pay

## 2022-09-12 ENCOUNTER — Ambulatory Visit: Payer: Medicare HMO | Admitting: Anesthesiology

## 2022-09-12 ENCOUNTER — Ambulatory Visit
Admission: RE | Admit: 2022-09-12 | Discharge: 2022-09-12 | Disposition: A | Discharge status: Home / Self Care | Payer: Medicare HMO | Attending: General Surgery | Admitting: General Surgery

## 2022-09-12 ENCOUNTER — Encounter: Payer: Self-pay | Admitting: General Surgery

## 2022-09-12 ENCOUNTER — Encounter
Admission: RE | Disposition: A | Discharge status: Home / Self Care | Payer: Self-pay | Source: Home / Self Care | Attending: General Surgery

## 2022-09-12 DIAGNOSIS — I10 Essential (primary) hypertension: Secondary | ICD-10-CM | POA: Diagnosis not present

## 2022-09-12 DIAGNOSIS — K4091 Unilateral inguinal hernia, without obstruction or gangrene, recurrent: Secondary | ICD-10-CM | POA: Diagnosis not present

## 2022-09-12 DIAGNOSIS — Z8673 Personal history of transient ischemic attack (TIA), and cerebral infarction without residual deficits: Secondary | ICD-10-CM | POA: Diagnosis not present

## 2022-09-12 DIAGNOSIS — D176 Benign lipomatous neoplasm of spermatic cord: Secondary | ICD-10-CM | POA: Diagnosis not present

## 2022-09-12 DIAGNOSIS — K4031 Unilateral inguinal hernia, with obstruction, without gangrene, recurrent: Secondary | ICD-10-CM | POA: Diagnosis not present

## 2022-09-12 DIAGNOSIS — K4090 Unilateral inguinal hernia, without obstruction or gangrene, not specified as recurrent: Secondary | ICD-10-CM | POA: Diagnosis not present

## 2022-09-12 HISTORY — PX: INSERTION OF MESH: SHX5868

## 2022-09-12 HISTORY — PX: INGUINAL HERNIA REPAIR: SHX194

## 2022-09-12 SURGERY — REPAIR, HERNIA, INGUINAL, ADULT
Anesthesia: General | Site: Inguinal | Laterality: Right

## 2022-09-12 MED ORDER — PROMETHAZINE HCL 25 MG/ML IJ SOLN: 6.2500 mg | INTRAMUSCULAR | Status: DC | PRN

## 2022-09-12 MED ORDER — ROCURONIUM BROMIDE 10 MG/ML (PF) SYRINGE
PREFILLED_SYRINGE | INTRAVENOUS | Status: AC
  Filled 2022-09-12: qty 10

## 2022-09-12 MED ORDER — LIDOCAINE HCL (CARDIAC) PF 100 MG/5ML IV SOSY
PREFILLED_SYRINGE | INTRAVENOUS | Status: DC | PRN
  Administered 2022-09-12: 100 mg via INTRAVENOUS

## 2022-09-12 MED ORDER — HYDROCODONE-ACETAMINOPHEN 5-325 MG PO TABS: 1.0000 | ORAL_TABLET | ORAL | 0 refills | Status: AC | PRN

## 2022-09-12 MED ORDER — PROPOFOL 10 MG/ML IV BOLUS
INTRAVENOUS | Status: DC | PRN
  Administered 2022-09-12: 100 mg via INTRAVENOUS

## 2022-09-12 MED ORDER — CHLORHEXIDINE GLUCONATE 0.12 % MT SOLN
OROMUCOSAL | Status: AC
  Filled 2022-09-12: qty 15

## 2022-09-12 MED ORDER — CEFAZOLIN SODIUM-DEXTROSE 2-4 GM/100ML-% IV SOLN
INTRAVENOUS | Status: AC
  Filled 2022-09-12: qty 100

## 2022-09-12 MED ORDER — OXYCODONE HCL 5 MG/5ML PO SOLN: 5.0000 mg | Freq: Once | ORAL | Status: DC | PRN

## 2022-09-12 MED ORDER — DROPERIDOL 2.5 MG/ML IJ SOLN: 0.6250 mg | Freq: Once | INTRAMUSCULAR | Status: DC | PRN

## 2022-09-12 MED ORDER — FENTANYL CITRATE (PF) 100 MCG/2ML IJ SOLN: 25.0000 ug | INTRAMUSCULAR | Status: DC | PRN

## 2022-09-12 MED ORDER — DEXAMETHASONE SODIUM PHOSPHATE 10 MG/ML IJ SOLN
INTRAMUSCULAR | Status: DC | PRN
  Administered 2022-09-12: 5 mg via INTRAVENOUS

## 2022-09-12 MED ORDER — BUPIVACAINE HCL (PF) 0.25 % IJ SOLN
INTRAMUSCULAR | Status: AC
  Filled 2022-09-12: qty 30

## 2022-09-12 MED ORDER — ACETAMINOPHEN 10 MG/ML IV SOLN
INTRAVENOUS | Status: DC | PRN
  Administered 2022-09-12: 1000 mg via INTRAVENOUS

## 2022-09-12 MED ORDER — SUGAMMADEX SODIUM 200 MG/2ML IV SOLN
INTRAVENOUS | Status: DC | PRN
  Administered 2022-09-12: 200 mg via INTRAVENOUS

## 2022-09-12 MED ORDER — FAMOTIDINE 20 MG PO TABS
ORAL_TABLET | ORAL | Status: AC
  Filled 2022-09-12: qty 1

## 2022-09-12 MED ORDER — FENTANYL CITRATE (PF) 100 MCG/2ML IJ SOLN
INTRAMUSCULAR | Status: DC | PRN
  Administered 2022-09-12 (×2): 50 ug via INTRAVENOUS

## 2022-09-12 MED ORDER — LIDOCAINE HCL (PF) 2 % IJ SOLN
INTRAMUSCULAR | Status: AC
  Filled 2022-09-12: qty 5

## 2022-09-12 MED ORDER — FENTANYL CITRATE (PF) 100 MCG/2ML IJ SOLN
INTRAMUSCULAR | Status: AC
  Filled 2022-09-12: qty 2

## 2022-09-12 MED ORDER — ACETAMINOPHEN 10 MG/ML IV SOLN: 1000.0000 mg | Freq: Once | INTRAVENOUS | Status: DC | PRN

## 2022-09-12 MED ORDER — PROPOFOL 10 MG/ML IV BOLUS
INTRAVENOUS | Status: AC
  Filled 2022-09-12: qty 20

## 2022-09-12 MED ORDER — ONDANSETRON HCL 4 MG/2ML IJ SOLN
INTRAMUSCULAR | Status: DC | PRN
  Administered 2022-09-12: 4 mg via INTRAVENOUS

## 2022-09-12 MED ORDER — EPINEPHRINE PF 1 MG/ML IJ SOLN
INTRAMUSCULAR | Status: AC
  Filled 2022-09-12: qty 1

## 2022-09-12 MED ORDER — 0.9 % SODIUM CHLORIDE (POUR BTL) OPTIME
TOPICAL | Status: DC | PRN
  Administered 2022-09-12: 200 mL

## 2022-09-12 MED ORDER — DEXAMETHASONE SODIUM PHOSPHATE 10 MG/ML IJ SOLN
INTRAMUSCULAR | Status: AC
  Filled 2022-09-12: qty 1

## 2022-09-12 MED ORDER — BUPIVACAINE-EPINEPHRINE 0.25% -1:200000 IJ SOLN
INTRAMUSCULAR | Status: DC | PRN
  Administered 2022-09-12: 30 mL

## 2022-09-12 MED ORDER — ROCURONIUM BROMIDE 100 MG/10ML IV SOLN
INTRAVENOUS | Status: DC | PRN
  Administered 2022-09-12: 60 mg via INTRAVENOUS

## 2022-09-12 MED ORDER — OXYCODONE HCL 5 MG PO TABS: 5.0000 mg | ORAL_TABLET | Freq: Once | ORAL | Status: DC | PRN

## 2022-09-12 SURGICAL SUPPLY — 31 items
ADH SKN CLS APL DERMABOND .7 (GAUZE/BANDAGES/DRESSINGS) ×2
APL PRP STRL LF DISP 70% ISPRP (MISCELLANEOUS) ×2
BLADE SURG 15 STRL LF DISP TIS (BLADE) ×2 IMPLANT
BLADE SURG 15 STRL SS (BLADE) ×2
CHLORAPREP W/TINT 26 (MISCELLANEOUS) ×2 IMPLANT
DERMABOND ADVANCED .7 DNX12 (GAUZE/BANDAGES/DRESSINGS) ×2 IMPLANT
DRAIN PENROSE 12X.25 LTX STRL (MISCELLANEOUS) ×2 IMPLANT
DRAPE LAPAROTOMY 100X77 ABD (DRAPES) ×2 IMPLANT
ELECT REM PT RETURN 9FT ADLT (ELECTROSURGICAL) ×2
ELECTRODE REM PT RTRN 9FT ADLT (ELECTROSURGICAL) ×2 IMPLANT
GAUZE 4X4 16PLY ~~LOC~~+RFID DBL (SPONGE) ×2 IMPLANT
GLOVE BIO SURGEON STRL SZ 6.5 (GLOVE) ×4 IMPLANT
GLOVE BIOGEL PI IND STRL 6.5 (GLOVE) ×2 IMPLANT
GOWN STRL REUS W/ TWL LRG LVL3 (GOWN DISPOSABLE) ×4 IMPLANT
GOWN STRL REUS W/TWL LRG LVL3 (GOWN DISPOSABLE) ×4
LABEL OR SOLS (LABEL) ×2 IMPLANT
MANIFOLD NEPTUNE II (INSTRUMENTS) ×2 IMPLANT
MESH SYNTHETIC 1.8X4 KEYHOLE S (Mesh General) IMPLANT
NDL HYPO 22X1.5 SAFETY MO (MISCELLANEOUS) ×2 IMPLANT
NEEDLE HYPO 22X1.5 SAFETY MO (MISCELLANEOUS) ×2 IMPLANT
NS IRRIG 500ML POUR BTL (IV SOLUTION) ×2 IMPLANT
SUT MNCRL 4-0 (SUTURE) ×2
SUT MNCRL 4-0 27XMFL (SUTURE) ×2
SUT SURGILON 0 BLK (SUTURE) ×4 IMPLANT
SUT VIC AB 2-0 BRD 54 (SUTURE) ×2 IMPLANT
SUT VIC AB 2-0 CT2 27 (SUTURE) ×2 IMPLANT
SUT VIC AB 3-0 SH 27 (SUTURE) ×2
SUT VIC AB 3-0 SH 27X BRD (SUTURE) ×4 IMPLANT
SUTURE MNCRL 4-0 27XMF (SUTURE) ×2 IMPLANT
SYR 10ML LL (SYRINGE) ×2 IMPLANT
TRAP FLUID SMOKE EVACUATOR (MISCELLANEOUS) ×2 IMPLANT

## 2022-09-12 NOTE — Transfer of Care (Signed)
Immediate Anesthesia Transfer of Care Note  Patient: Alex Malone  Procedure(s) Performed: HERNIA REPAIR INGUINAL ADULT (Right: Groin) INSERTION OF MESH (Right: Inguinal)  Patient Location: PACU  Anesthesia Type:General  Level of Consciousness: awake, alert , and drowsy  Airway & Oxygen Therapy: Patient Spontanous Breathing  Post-op Assessment: Report given to RN and Post -op Vital signs reviewed and stable  Post vital signs: Reviewed and stable  Last Vitals:  Vitals Value Taken Time  BP 116/72 09/12/22 0900  Temp 35.8   Pulse 66 09/12/22 0904  Resp 18 09/12/22 0904  SpO2 98 % 09/12/22 0904  Vitals shown include unvalidated device data.  Last Pain:  Vitals:   09/12/22 0608  TempSrc: Oral  PainSc: 0-No pain         Complications: No notable events documented.

## 2022-09-12 NOTE — Interval H&P Note (Signed)
History and Physical Interval Note:  09/12/2022 6:54 AM  Alex Malone  has presented today for surgery, with the diagnosis of K40.90 unilateral inguinal hernia w/o obstruction or gangrene.  The various methods of treatment have been discussed with the patient and family. After consideration of risks, benefits and other options for treatment, the patient has consented to  Procedure(s): HERNIA REPAIR INGUINAL ADULT (Right) as a surgical intervention.  The patient's history has been reviewed, patient examined, no change in status, stable for surgery.  I have reviewed the patient's chart and labs.  Questions were answered to the patient's satisfaction.     Carolan Shiver

## 2022-09-12 NOTE — Anesthesia Postprocedure Evaluation (Signed)
Anesthesia Post Note  Patient: Alex Malone  Procedure(s) Performed: HERNIA REPAIR INGUINAL ADULT (Right: Groin) INSERTION OF MESH (Right: Inguinal)  Patient location during evaluation: PACU Anesthesia Type: General Level of consciousness: awake and alert Pain management: pain level controlled Vital Signs Assessment: post-procedure vital signs reviewed and stable Respiratory status: spontaneous breathing, nonlabored ventilation, respiratory function stable and patient connected to nasal cannula oxygen Cardiovascular status: blood pressure returned to baseline and stable Postop Assessment: no apparent nausea or vomiting Anesthetic complications: no   No notable events documented.   Last Vitals:  Vitals:   09/12/22 0944 09/12/22 0955  BP: 135/77 (!) 156/80  Pulse: (!) 59 (!) 56  Resp: 11 18  Temp: (!) 36.3 C (!) 36.1 C  SpO2: 99% 99%    Last Pain:  Vitals:   09/12/22 0955  TempSrc: Temporal  PainSc: 0-No pain                 Yevette Edwards

## 2022-09-12 NOTE — Anesthesia Procedure Notes (Signed)
Procedure Name: Intubation Date/Time: 09/12/2022 7:44 AM  Performed by: Morene Crocker, CRNAPre-anesthesia Checklist: Patient identified, Patient being monitored, Timeout performed, Emergency Drugs available and Suction available Patient Re-evaluated:Patient Re-evaluated prior to induction Oxygen Delivery Method: Circle system utilized Preoxygenation: Pre-oxygenation with 100% oxygen Induction Type: IV induction Ventilation: Mask ventilation without difficulty Laryngoscope Size: 3 and McGraph Grade View: Grade I Tube type: Oral Tube size: 7.5 mm Number of attempts: 1 Airway Equipment and Method: Stylet Placement Confirmation: ETT inserted through vocal cords under direct vision, positive ETCO2 and breath sounds checked- equal and bilateral Secured at: 22 cm Tube secured with: Tape Dental Injury: Teeth and Oropharynx as per pre-operative assessment  Comments: Smooth, atraumatic intubation. No complications noted

## 2022-09-12 NOTE — Anesthesia Preprocedure Evaluation (Signed)
Anesthesia Evaluation  Patient identified by MRN, date of birth, ID band Patient awake    Reviewed: Allergy & Precautions, H&P , NPO status , Patient's Chart, lab work & pertinent test results, reviewed documented beta blocker date and time   Airway Mallampati: II  TM Distance: >3 FB Neck ROM: full    Dental  (+) Teeth Intact   Pulmonary neg pulmonary ROS   Pulmonary exam normal        Cardiovascular Exercise Tolerance: Poor hypertension, On Medications negative cardio ROS Normal cardiovascular exam Rhythm:regular Rate:Normal     Neuro/Psych   Anxiety     CVA, Residual Symptoms  negative psych ROS   GI/Hepatic negative GI ROS, Neg liver ROS,,,  Endo/Other  negative endocrine ROS    Renal/GU negative Renal ROS  negative genitourinary   Musculoskeletal   Abdominal   Peds  Hematology negative hematology ROS (+)   Anesthesia Other Findings Past Medical History: No date: Anxiety No date: History of lacunar cerebrovascular accident (CVA) No date: History of prostate cancer No date: History of subdural hematoma No date: HOH (hard of hearing) No date: Hyperlipidemia 11/25/2012: Hyperlipidemia, unspecified No date: Hypertension No date: Hyperuricemia Past Surgical History: No date: BACK SURGERY 06/22/2020: CATARACT EXTRACTION W/PHACO; Left     Comment:  Procedure: CATARACT EXTRACTION PHACO AND INTRAOCULAR               LENS PLACEMENT (IOC) LEFT AHMED TUBE SHUNT W/TUTOPLAST               3.04 00:26.7;  Surgeon: Nevada Crane, MD;                Location: [facility];  Service: Ophthalmology;                Laterality: Left;  90 minute case per Lennox Laity No date: CHOLECYSTECTOMY No date: EVALUATION UNDER ANESTHESIA WITH HEMORRHOIDECTOMY No date: HERNIA REPAIR 07/11/2017: INSERTION OF MESH; N/A     Comment:  Procedure: INSERTION OF MESH;  Surgeon: Lattie Haw, MD;  Location: [facility];   Service: General;                Laterality: N/A; No date: PERINEAL PROSTATECTOMY No date: subdermal hematoma 07/11/2017: VENTRAL HERNIA REPAIR; N/A     Comment:  Procedure: HERNIA REPAIR RECURRENT VENTRAL ADULT;                Surgeon: Lattie Haw, MD;  Location: [facility];                Service: General;  Laterality: N/A; BMI    Body Mass Index: 23.24 kg/m     Reproductive/Obstetrics negative OB ROS                             Anesthesia Physical Anesthesia Plan  ASA: 3  Anesthesia Plan: General ETT   Post-op Pain Management:    Induction:   PONV Risk Score and Plan: 3  Airway Management Planned:   Additional Equipment:   Intra-op Plan:   Post-operative Plan:   Informed Consent: I have reviewed the patients History and Physical, chart, labs and discussed the procedure including the risks, benefits and alternatives for the proposed anesthesia with the patient or authorized representative who has indicated his/her understanding and acceptance.     Dental Advisory Given  Plan Discussed  with: CRNA  Anesthesia Plan Comments:        Anesthesia Quick Evaluation

## 2022-09-12 NOTE — Op Note (Signed)
Preoperative diagnosis: Right recurrent Inguinal Hernia.  Postoperative diagnosis: Right Recurrent Indirect Inguinal Hernia.  Procedure: Right Recurrent Inguinal hernia repair with mesh  Anesthesia: General  Surgeon: Dr. Hazle Quant  Wound Classification: Clean  Indications:  Patient is a 89 y.o. male developed a symptomatic right inguinal hernia with incarceration and obstruction. Repair was indicated to avoid complications of strangulation and pain, and a prosthetic mesh repair was elected.  Findings: 1. Vas Deferens and cord structures identified and preserved 2. A indirect inguinal hernia was identified 3. Pre Shaped mesh used for repair 4. Adequate hemostasis achieved  Description of procedure: The patient was taken to the operating room. A time-out was completed verifying correct patient, procedure, site, positioning, and implant(s) and/or special equipment prior to beginning this procedure. spinal anesthesia was induced. The right groin was prepped and draped in the usual sterile fashion. An incision was marked in a natural skin crease and planned to end near the pubic tubercle.  The skin crease incision was made with a knife and deepened through Scarpa's and Camper's fascia with electrocautery until the aponeurosis of the external oblique was encountered. This was cleaned and the external ring was exposed. Hemostasis was achieved in the wound. An incision was made in the midportion of the external oblique aponeurosis in the direction of its fibers. The ilioinguinal nerve was identified and protected throughout the dissection. Flaps of the external oblique were developed cephalad and inferiorly.  The cord was identified. It was gently dissected free at the pubic tubercle and encircled with a Penrose drain. Attention was directed to the anteromedial aspect of the cord, where an indirect hernia sac was identified. The sac was carefully dissected free of the cord down to the level of the  internal ring. The vas and testicular vessels were identified and protected from harm. The sac was opened and contents were reduced. A finger was passed into the peritoneal cavity and the floor of the inguinal canal assessed and found to be strong. The femoral canal was palpated and no hernia identified. The sac was twisted and suture ligated with 2-0 silk. Redundant sac was excised and submitted to pathology. The stump of the sac was checked for hemostasis and allowed to retract into the abdomen.  Attention then turned to the floor of the canal, which appeared to be grossly weakened without a well-defined defect or sac. The mesh was inserted. Beginning at the pubic tubercle, the mesh was sutured to the inguinal ligament inferiorly and the conjoint tendon superiorly using interrupted 0 nonabsorbable sutures. Care was taken to assure that the mesh was placed in a relaxed fashion to avoid excessive tension and that no neurovascular structures were caught in the repair. Laterally, the tails of the mesh were crossed and the internal ring recreated.  Hemostasis was again checked. The Penrose drain was removed. The external oblique aponeurosis was closed with a running suture of 3-0 Vicryl, taking care not to catch the ilioinguinal nerve in the suture line. Scarpa's fascia was closed with interrupted 3-0 Vicryl.  The skin was closed with a subcuticular stitch of Monocryl 4-0. Dermabond was applied.  The testis was gently pulled down into its anatomic position in the scrotum.  The patient tolerated the procedure well and was taken to the postanesthesia care unit in stable condition.   Specimen: Hernia sac and cord lipoma  Complications: None  Estimated Blood Loss: 5 mL

## 2022-09-12 NOTE — Discharge Instructions (Addendum)
  Diet: Resume home heart healthy regular diet.   Activity: No heavy lifting >20 pounds (children, pets, laundry, garbage) or strenuous activity until follow-up, but light activity and walking are encouraged. Do not drive or drink alcohol if taking narcotic pain medications.  Wound care: May shower with soapy water and pat dry (do not rub incisions), but no baths or submerging incision underwater until follow-up. (no swimming)   Medications: Resume all home medications. For mild to moderate pain: acetaminophen (Tylenol) ***or ibuprofen (if no kidney disease). Combining Tylenol with alcohol can substantially increase your risk of causing liver disease. Narcotic pain medications, if prescribed, can be used for severe pain, though may cause nausea, constipation, and drowsiness. Do not combine Tylenol and Norco within a 6 hour period as Norco contains Tylenol. If you do not need the narcotic pain medication, you do not need to fill the prescription.  Call office ([phone]) at any time if any questions, worsening pain, fevers/chills, bleeding, drainage from incision site, or other concerns.   AMBULATORY SURGERY  DISCHARGE INSTRUCTIONS   The drugs that you were given will stay in your system until tomorrow so for the next 24 hours you should not:  Drive an automobile Make any legal decisions Drink any alcoholic beverage   You may resume regular meals tomorrow.  Today it is better to start with liquids and gradually work up to solid foods.  You may eat anything you prefer, but it is better to start with liquids, then soup and crackers, and gradually work up to solid foods.   Please notify your doctor immediately if you have any unusual bleeding, trouble breathing, redness and pain at the surgery site, drainage, fever, or pain not relieved by medication.    Additional Instructions:        Please contact your physician with any problems or Same Day Surgery at [phone], Monday  through Friday 6 am to 4 pm, or [place] [facility] [place] Main number at [phone].  

## 2022-09-13 ENCOUNTER — Encounter: Payer: Self-pay | Admitting: General Surgery

## 2023-01-20 DIAGNOSIS — H26493 Other secondary cataract, bilateral: Secondary | ICD-10-CM | POA: Diagnosis not present

## 2023-01-20 DIAGNOSIS — H401133 Primary open-angle glaucoma, bilateral, severe stage: Secondary | ICD-10-CM | POA: Diagnosis not present

## 2023-02-22 DIAGNOSIS — R7302 Impaired glucose tolerance (oral): Secondary | ICD-10-CM | POA: Diagnosis not present

## 2023-02-22 DIAGNOSIS — E782 Mixed hyperlipidemia: Secondary | ICD-10-CM | POA: Diagnosis not present

## 2023-03-03 DIAGNOSIS — R7302 Impaired glucose tolerance (oral): Secondary | ICD-10-CM | POA: Diagnosis not present

## 2023-03-03 DIAGNOSIS — E782 Mixed hyperlipidemia: Secondary | ICD-10-CM | POA: Diagnosis not present

## 2023-03-03 DIAGNOSIS — F419 Anxiety disorder, unspecified: Secondary | ICD-10-CM | POA: Diagnosis not present

## 2023-03-03 DIAGNOSIS — I10 Essential (primary) hypertension: Secondary | ICD-10-CM | POA: Diagnosis not present

## 2023-03-03 DIAGNOSIS — F320 Major depressive disorder, single episode, mild: Secondary | ICD-10-CM | POA: Diagnosis not present

## 2023-03-03 DIAGNOSIS — Z23 Encounter for immunization: Secondary | ICD-10-CM | POA: Diagnosis not present

## 2023-03-03 DIAGNOSIS — Z0001 Encounter for general adult medical examination with abnormal findings: Secondary | ICD-10-CM | POA: Diagnosis not present

## 2023-03-17 ENCOUNTER — Emergency Department
Admission: EM | Admit: 2023-03-17 | Discharge: 2023-03-17 | Disposition: A | Discharge status: Home / Self Care | Payer: Medicare HMO | Attending: Emergency Medicine | Admitting: Emergency Medicine

## 2023-03-17 ENCOUNTER — Emergency Department: Payer: Medicare HMO

## 2023-03-17 ENCOUNTER — Other Ambulatory Visit: Payer: Self-pay

## 2023-03-17 DIAGNOSIS — I771 Stricture of artery: Secondary | ICD-10-CM | POA: Diagnosis not present

## 2023-03-17 DIAGNOSIS — R079 Chest pain, unspecified: Secondary | ICD-10-CM | POA: Diagnosis not present

## 2023-03-17 DIAGNOSIS — R0789 Other chest pain: Secondary | ICD-10-CM | POA: Diagnosis not present

## 2023-03-17 DIAGNOSIS — Z5321 Procedure and treatment not carried out due to patient leaving prior to being seen by health care provider: Secondary | ICD-10-CM | POA: Diagnosis not present

## 2023-03-17 DIAGNOSIS — R918 Other nonspecific abnormal finding of lung field: Secondary | ICD-10-CM | POA: Diagnosis not present

## 2023-03-17 LAB — BASIC METABOLIC PANEL
Anion gap: 11 (ref 5–15)
BUN: 18 mg/dL (ref 8–23)
CO2: 24 mmol/L (ref 22–32)
Calcium: 9.8 mg/dL (ref 8.9–10.3)
Chloride: 105 mmol/L (ref 98–111)
Creatinine, Ser: 0.98 mg/dL (ref 0.61–1.24)
GFR, Estimated: >60 mL/min (ref >60)
Glucose, Bld: 98 mg/dL (ref 70–99)
Potassium: 3.8 mmol/L (ref 3.5–5.1)
Sodium: 140 mmol/L (ref 135–145)

## 2023-03-17 LAB — CBC
HCT: 47.1 % (ref 39.0–52.0)
Hemoglobin: 15.2 g/dL (ref 13.0–17.0)
MCH: 29.9 pg (ref 26.0–34.0)
MCHC: 32.3 g/dL (ref 30.0–36.0)
MCV: 92.7 fL (ref 80.0–100.0)
Platelets: 198 10*3/uL (ref 150–400)
RBC: 5.08 MIL/uL (ref 4.22–5.81)
RDW: 13.8 % (ref 11.5–15.5)
WBC: 7.5 10*3/uL (ref 4.0–10.5)
nRBC: 0 % (ref 0.0–0.2)

## 2023-03-17 LAB — TROPONIN I (HIGH SENSITIVITY): Troponin I (High Sensitivity): 6 ng/L (ref <18)

## 2023-03-17 NOTE — ED Triage Notes (Signed)
Pt reports chest pain that occurs mostly at night, states that it starts at his collar bone and radiates to the left chest, denies any pain at this time, denies hx of heart issues

## 2023-03-17 NOTE — ED Provider Triage Note (Signed)
Emergency Medicine Provider Triage Evaluation Note  Alex Malone , a 89 y.o. male  was evaluated in triage.  Pt complains of chest pain, worse at night, radiates from clavicle to left shoulder into chest.  Review of Systems  Positive:  Negative:   Physical Exam  Ht 6\' 2"  (1.88 m)   Wt 84.8 kg   BMI 24.01 kg/m  Gen:   Awake, no distress   Resp:  Normal effort  MSK:   Moves extremities without difficulty  Other:    Medical Decision Making  Medically screening exam initiated at 1:25 PM.  Appropriate orders placed.  Cris Newgard was informed that the remainder of the evaluation will be completed by another provider, this initial triage assessment does not replace that evaluation, and the importance of remaining in the ED until their evaluation is complete.  No chest pain at this time, no history of MI   Faythe Ghee, PA-C 03/17/23 1325

## 2023-03-30 DIAGNOSIS — I700 Atherosclerosis of aorta: Secondary | ICD-10-CM | POA: Diagnosis not present

## 2023-03-30 DIAGNOSIS — R079 Chest pain, unspecified: Secondary | ICD-10-CM | POA: Diagnosis not present

## 2023-04-05 DIAGNOSIS — I10 Essential (primary) hypertension: Secondary | ICD-10-CM | POA: Diagnosis not present

## 2023-04-05 DIAGNOSIS — I2089 Other forms of angina pectoris: Secondary | ICD-10-CM | POA: Diagnosis not present

## 2023-04-05 DIAGNOSIS — F419 Anxiety disorder, unspecified: Secondary | ICD-10-CM | POA: Diagnosis not present

## 2023-04-05 DIAGNOSIS — E782 Mixed hyperlipidemia: Secondary | ICD-10-CM | POA: Diagnosis not present

## 2023-04-05 DIAGNOSIS — R42 Dizziness and giddiness: Secondary | ICD-10-CM | POA: Diagnosis not present

## 2023-04-05 DIAGNOSIS — R079 Chest pain, unspecified: Secondary | ICD-10-CM | POA: Diagnosis not present

## 2023-04-12 ENCOUNTER — Other Ambulatory Visit: Payer: Self-pay | Admitting: Internal Medicine

## 2023-04-12 DIAGNOSIS — E782 Mixed hyperlipidemia: Secondary | ICD-10-CM

## 2023-04-12 DIAGNOSIS — I2089 Other forms of angina pectoris: Secondary | ICD-10-CM

## 2023-04-17 DIAGNOSIS — I2089 Other forms of angina pectoris: Secondary | ICD-10-CM | POA: Diagnosis not present

## 2023-04-26 ENCOUNTER — Emergency Department: Payer: Medicare HMO

## 2023-04-26 ENCOUNTER — Emergency Department
Admission: EM | Admit: 2023-04-26 | Discharge: 2023-04-26 | Disposition: A | Discharge status: Home / Self Care | Payer: Medicare HMO | Attending: Emergency Medicine | Admitting: Emergency Medicine

## 2023-04-26 ENCOUNTER — Other Ambulatory Visit: Payer: Self-pay

## 2023-04-26 DIAGNOSIS — R42 Dizziness and giddiness: Secondary | ICD-10-CM | POA: Insufficient documentation

## 2023-04-26 DIAGNOSIS — I10 Essential (primary) hypertension: Secondary | ICD-10-CM | POA: Diagnosis not present

## 2023-04-26 LAB — URINALYSIS, ROUTINE W REFLEX MICROSCOPIC
Bacteria, UA: NONE SEEN
Bilirubin Urine: NEGATIVE
Glucose, UA: NEGATIVE mg/dL
Ketones, ur: NEGATIVE mg/dL
Leukocytes,Ua: NEGATIVE
Nitrite: NEGATIVE
Protein, ur: NEGATIVE mg/dL
Specific Gravity, Urine: 1.003 — ABNORMAL LOW (ref 1.005–1.030)
pH: 8 (ref 5.0–8.0)

## 2023-04-26 LAB — BASIC METABOLIC PANEL
Anion gap: 13 (ref 5–15)
BUN: 15 mg/dL (ref 8–23)
CO2: 23 mmol/L (ref 22–32)
Calcium: 10.3 mg/dL (ref 8.9–10.3)
Chloride: 105 mmol/L (ref 98–111)
Creatinine, Ser: 1.03 mg/dL (ref 0.61–1.24)
GFR, Estimated: >60 mL/min (ref >60)
Glucose, Bld: 121 mg/dL — ABNORMAL HIGH (ref 70–99)
Potassium: 4 mmol/L (ref 3.5–5.1)
Sodium: 141 mmol/L (ref 135–145)

## 2023-04-26 LAB — CBC
HCT: 46.7 % (ref 39.0–52.0)
Hemoglobin: 15.5 g/dL (ref 13.0–17.0)
MCH: 30 pg (ref 26.0–34.0)
MCHC: 33.2 g/dL (ref 30.0–36.0)
MCV: 90.3 fL (ref 80.0–100.0)
Platelets: 213 10*3/uL (ref 150–400)
RBC: 5.17 MIL/uL (ref 4.22–5.81)
RDW: 13.8 % (ref 11.5–15.5)
WBC: 9.2 10*3/uL (ref 4.0–10.5)
nRBC: 0 % (ref 0.0–0.2)

## 2023-04-26 LAB — TROPONIN I (HIGH SENSITIVITY)
Troponin I (High Sensitivity): 7 ng/L (ref <18)
Troponin I (High Sensitivity): 21 ng/L — ABNORMAL HIGH (ref <18)
Troponin I (High Sensitivity): 30 ng/L — ABNORMAL HIGH (ref <18)

## 2023-04-26 MED ORDER — MECLIZINE HCL 25 MG PO TABS
50.0000 mg | ORAL_TABLET | Freq: Once | ORAL | Status: AC
  Administered 2023-04-26: 50 mg via ORAL
  Filled 2023-04-26: qty 2

## 2023-04-26 NOTE — ED Provider Notes (Signed)
[facility] Provider Note   Event Date/Time   First MD Initiated Contact with Patient 04/26/23 1146     (approximate) History  Dizziness  HPI Alex Malone is a 89 y.o. male with a past medical history of vertigo who presents complaining of vertiginous symptoms including being unsteady on his feet that began around 7:30 PM yesterday.  Patient states that he came to the emergency department at 2 AM after his home meclizine was not controlling his symptoms.  Patient denies any palpitations, chest pain, diaphoresis, or headache preceding these symptoms of vertigo. ROS: Patient currently denies any vision changes, tinnitus, difficulty speaking, facial droop, sore throat, chest pain, shortness of breath, abdominal pain, nausea/vomiting/diarrhea, dysuria, or weakness/numbness/paresthesias in any extremity   Physical Exam  Triage Vital Signs: ED Triage Vitals  Encounter Vitals Group     BP 04/26/23 0530 (!) 145/93     Systolic BP Percentile --      Diastolic BP Percentile --      Pulse Rate 04/26/23 0530 92     Resp 04/26/23 0530 18     Temp 04/26/23 0530 97.6 F (36.4 C)     Temp Source 04/26/23 0530 Oral     SpO2 04/26/23 0530 96 %     Weight 04/26/23 0529 182 lb (82.6 kg)     Height 04/26/23 0529 6\' 2"  (1.88 m)     Head Circumference --      Peak Flow --      Pain Score 04/26/23 0529 0     Pain Loc --      Pain Education --      Exclude from Growth Chart --    Most recent vital signs: Vitals:   04/26/23 0530 04/26/23 1118  BP: (!) 145/93 (!) 167/85  Pulse: 92 86  Resp: 18 18  Temp: 97.6 F (36.4 C) 98 F (36.7 C)  SpO2: 96% 96%   General: Awake, oriented x4. CV:  Good peripheral perfusion.  Resp:  Normal effort.  Abd:  No distention.  Other:  Elderly well-developed, well-nourished Caucasian male resting comfortably in no acute distress ED Results / Procedures / Treatments  Labs (all labs ordered are listed, but only abnormal results are  displayed) Labs Reviewed  BASIC METABOLIC PANEL - Abnormal; Notable for the following components:      Result Value   Glucose, Bld 121 (*)    All other components within normal limits  URINALYSIS, ROUTINE W REFLEX MICROSCOPIC - Abnormal; Notable for the following components:   Color, Urine COLORLESS (*)    APPearance CLEAR (*)    Specific Gravity, Urine 1.003 (*)    Hgb urine dipstick SMALL (*)    All other components within normal limits  TROPONIN I (HIGH SENSITIVITY) - Abnormal; Notable for the following components:   Troponin I (High Sensitivity) 21 (*)    All other components within normal limits  CBC  TROPONIN I (HIGH SENSITIVITY)   EKG ED ECG REPORT I, Merwyn Katos, the attending physician, personally viewed and interpreted this ECG. Date: 04/26/2023 EKG Time: 0544 Rate: 91 Rhythm: normal sinus rhythm QRS Axis: normal Intervals: Right bundle branch block ST/T Wave abnormalities: normal Narrative Interpretation: NSR with RBBB.  No evidence of acute ischemia RADIOLOGY ED MD interpretation: CT of the head without contrast interpreted by me shows no evidence of acute abnormalities including no intracerebral hemorrhage, obvious masses, or significant edema.  There is incidentally found left subdural calcifications and thickening related to prior  hemorrhage, chronic bilateral basal ganglia and right thalamic lacunar infarcts, atrophy, and chronic microvascular disease -Agree with radiology assessment Official radiology report(s): CT Head Wo Contrast Result Date: 04/26/2023 CLINICAL DATA:  Syncope/presyncope, cerebrovascular cause suspected. Dizziness. EXAM: CT HEAD WITHOUT CONTRAST TECHNIQUE: Contiguous axial images were obtained from the base of the skull through the vertex without intravenous contrast. RADIATION DOSE REDUCTION: This exam was performed according to the departmental dose-optimization program which includes automated exposure control, adjustment of the mA and/or kV  according to patient size and/or use of iterative reconstruction technique. COMPARISON:  08/08/2011 FINDINGS: Brain: There is atrophy and chronic small vessel disease changes. Chronic appearing bilateral basal ganglia and right thalamic lacunar infarcts. No acute intracranial abnormality. Specifically, no hemorrhage, hydrocephalus, mass lesion, acute infarction, or significant intracranial injury. Chronic left subdural thickening and calcifications related to prior hemorrhage. Vascular: No hyperdense vessel or unexpected calcification. Skull: No acute calvarial abnormality.  Previous left craniotomy. Sinuses/Orbits: No acute findings Other: None IMPRESSION: Left subdural calcifications in thickening related to prior hemorrhage. Chronic bilateral basal ganglia and right thalamic lacunar infarcts. Atrophy, chronic microvascular disease. No acute intracranial abnormality. Electronically Signed   By: Charlett Nose M.D.   On: 04/26/2023 10:25   PROCEDURES: Critical Care performed: No Procedures MEDICATIONS ORDERED IN ED: Medications  meclizine (ANTIVERT) tablet 50 mg (50 mg Oral Given 04/26/23 1246)   IMPRESSION / MDM / ASSESSMENT AND PLAN / ED COURSE  I reviewed the triage vital signs and the nursing notes.                             The patient is on the cardiac monitor to evaluate for evidence of arrhythmia and/or significant heart rate changes. Patient's presentation is most consistent with acute presentation with potential threat to life or bodily function. Based on History, Exam, and Findings, presentation not consistent with syncope, seizure, stroke, meningitis, symptomatic anemia (gastrointestinal bleed), Increased ICP (cerebral tumor/mass), ICH. Additionally, I have a low suspicion for AOM, labyrinthitis, or other infectious process. Tx: meclizine Reassessment: Prior to discharge symptoms controlled, patient well appearing. Disposition:  Discharge. Strict return precautions discussed w/ full  understanding. Advise follow up with primary care provider within 24-48 hours.   FINAL CLINICAL IMPRESSION(S) / ED DIAGNOSES   Final diagnoses:  Vertigo   Rx / DC Orders   ED Discharge Orders     None      Note:  This document was prepared using Dragon voice recognition software and may include unintentional dictation errors.   Merwyn Katos, MD 04/26/23 [phone]

## 2023-04-26 NOTE — ED Notes (Signed)
Patient ambulatory with walker with no complaints of dizziness. Pt with even, and normal gait per family member.

## 2023-04-26 NOTE — ED Triage Notes (Signed)
Pt to ED via [facility] from home c/o dizziness x 1 month but got worse last night around midnight. Pt has been seen by cardiology and nuero for same. Pt denies CP, SHOB, fevers. Pt A&Ox4

## 2023-05-03 ENCOUNTER — Ambulatory Visit
Admission: RE | Admit: 2023-05-03 | Discharge: 2023-05-03 | Disposition: A | Discharge status: Home / Self Care | Payer: Self-pay | Source: Ambulatory Visit | Attending: Internal Medicine | Admitting: Internal Medicine

## 2023-05-03 DIAGNOSIS — I2089 Other forms of angina pectoris: Secondary | ICD-10-CM | POA: Insufficient documentation

## 2023-05-03 DIAGNOSIS — E782 Mixed hyperlipidemia: Secondary | ICD-10-CM | POA: Insufficient documentation

## 2023-05-22 DIAGNOSIS — I6529 Occlusion and stenosis of unspecified carotid artery: Secondary | ICD-10-CM | POA: Diagnosis not present

## 2023-05-22 DIAGNOSIS — H6123 Impacted cerumen, bilateral: Secondary | ICD-10-CM | POA: Diagnosis not present

## 2023-05-22 DIAGNOSIS — L299 Pruritus, unspecified: Secondary | ICD-10-CM | POA: Diagnosis not present

## 2023-05-22 DIAGNOSIS — H90A22 Sensorineural hearing loss, unilateral, left ear, with restricted hearing on the contralateral side: Secondary | ICD-10-CM | POA: Diagnosis not present

## 2023-05-22 DIAGNOSIS — R42 Dizziness and giddiness: Secondary | ICD-10-CM | POA: Diagnosis not present

## 2023-05-23 DIAGNOSIS — I10 Essential (primary) hypertension: Secondary | ICD-10-CM | POA: Diagnosis not present

## 2023-05-23 DIAGNOSIS — R079 Chest pain, unspecified: Secondary | ICD-10-CM | POA: Diagnosis not present

## 2023-05-23 DIAGNOSIS — E782 Mixed hyperlipidemia: Secondary | ICD-10-CM | POA: Diagnosis not present

## 2023-05-23 DIAGNOSIS — I2510 Atherosclerotic heart disease of native coronary artery without angina pectoris: Secondary | ICD-10-CM | POA: Diagnosis not present

## 2023-05-25 ENCOUNTER — Other Ambulatory Visit: Payer: Self-pay | Admitting: Otolaryngology

## 2023-05-25 DIAGNOSIS — R42 Dizziness and giddiness: Secondary | ICD-10-CM

## 2023-05-26 ENCOUNTER — Other Ambulatory Visit: Payer: Self-pay | Admitting: Otolaryngology

## 2023-05-26 DIAGNOSIS — R42 Dizziness and giddiness: Secondary | ICD-10-CM

## 2023-05-29 DIAGNOSIS — R42 Dizziness and giddiness: Secondary | ICD-10-CM | POA: Diagnosis not present

## 2023-05-31 ENCOUNTER — Other Ambulatory Visit: Payer: Medicare HMO

## 2023-06-01 ENCOUNTER — Ambulatory Visit
Admission: RE | Admit: 2023-06-01 | Discharge: 2023-06-01 | Disposition: A | Discharge status: Home / Self Care | Payer: Medicare HMO | Source: Ambulatory Visit | Attending: Otolaryngology | Admitting: Otolaryngology

## 2023-06-01 DIAGNOSIS — R42 Dizziness and giddiness: Secondary | ICD-10-CM | POA: Diagnosis not present

## 2023-06-01 DIAGNOSIS — I6523 Occlusion and stenosis of bilateral carotid arteries: Secondary | ICD-10-CM | POA: Diagnosis not present

## 2023-06-02 ENCOUNTER — Encounter: Payer: Self-pay | Admitting: Otolaryngology

## 2023-06-08 ENCOUNTER — Ambulatory Visit
Admission: RE | Admit: 2023-06-08 | Discharge: 2023-06-08 | Disposition: A | Discharge status: Home / Self Care | Payer: Medicare HMO | Source: Ambulatory Visit | Attending: Otolaryngology | Admitting: Otolaryngology

## 2023-06-08 DIAGNOSIS — R42 Dizziness and giddiness: Secondary | ICD-10-CM

## 2023-06-08 DIAGNOSIS — R531 Weakness: Secondary | ICD-10-CM | POA: Diagnosis not present

## 2023-06-08 DIAGNOSIS — R262 Difficulty in walking, not elsewhere classified: Secondary | ICD-10-CM | POA: Diagnosis not present

## 2023-06-08 MED ORDER — GADOPICLENOL 0.5 MMOL/ML IV SOLN
7.5000 mL | Freq: Once | INTRAVENOUS | Status: AC | PRN
  Administered 2023-06-08: 7.5 mL via INTRAVENOUS

## 2023-07-18 DIAGNOSIS — R2681 Unsteadiness on feet: Secondary | ICD-10-CM | POA: Diagnosis not present

## 2023-08-29 DIAGNOSIS — M79674 Pain in right toe(s): Secondary | ICD-10-CM | POA: Diagnosis not present

## 2023-08-29 DIAGNOSIS — L84 Corns and callosities: Secondary | ICD-10-CM | POA: Diagnosis not present

## 2023-08-29 DIAGNOSIS — B351 Tinea unguium: Secondary | ICD-10-CM | POA: Diagnosis not present

## 2023-08-29 DIAGNOSIS — M79675 Pain in left toe(s): Secondary | ICD-10-CM | POA: Diagnosis not present

## 2023-08-29 DIAGNOSIS — R7302 Impaired glucose tolerance (oral): Secondary | ICD-10-CM | POA: Diagnosis not present

## 2023-09-05 DIAGNOSIS — I10 Essential (primary) hypertension: Secondary | ICD-10-CM | POA: Diagnosis not present

## 2023-09-05 DIAGNOSIS — E782 Mixed hyperlipidemia: Secondary | ICD-10-CM | POA: Diagnosis not present

## 2023-09-05 DIAGNOSIS — R1084 Generalized abdominal pain: Secondary | ICD-10-CM | POA: Diagnosis not present

## 2023-09-05 DIAGNOSIS — F320 Major depressive disorder, single episode, mild: Secondary | ICD-10-CM | POA: Diagnosis not present

## 2023-09-05 DIAGNOSIS — Z0000 Encounter for general adult medical examination without abnormal findings: Secondary | ICD-10-CM | POA: Diagnosis not present

## 2023-09-05 DIAGNOSIS — F419 Anxiety disorder, unspecified: Secondary | ICD-10-CM | POA: Diagnosis not present

## 2023-09-05 DIAGNOSIS — R7302 Impaired glucose tolerance (oral): Secondary | ICD-10-CM | POA: Diagnosis not present

## 2023-09-05 DIAGNOSIS — Z1331 Encounter for screening for depression: Secondary | ICD-10-CM | POA: Diagnosis not present

## 2023-09-06 ENCOUNTER — Other Ambulatory Visit: Payer: Self-pay | Admitting: Internal Medicine

## 2023-09-06 DIAGNOSIS — Z0000 Encounter for general adult medical examination without abnormal findings: Secondary | ICD-10-CM

## 2023-09-06 DIAGNOSIS — R1084 Generalized abdominal pain: Secondary | ICD-10-CM

## 2023-09-07 DIAGNOSIS — R42 Dizziness and giddiness: Secondary | ICD-10-CM | POA: Diagnosis not present

## 2023-09-07 DIAGNOSIS — I639 Cerebral infarction, unspecified: Secondary | ICD-10-CM | POA: Diagnosis not present

## 2023-09-07 DIAGNOSIS — R2689 Other abnormalities of gait and mobility: Secondary | ICD-10-CM | POA: Diagnosis not present

## 2023-09-14 ENCOUNTER — Ambulatory Visit
Admission: RE | Admit: 2023-09-14 | Discharge: 2023-09-14 | Disposition: A | Discharge status: Home / Self Care | Source: Ambulatory Visit | Attending: Internal Medicine | Admitting: Internal Medicine

## 2023-09-14 DIAGNOSIS — R1084 Generalized abdominal pain: Secondary | ICD-10-CM | POA: Diagnosis not present

## 2023-09-14 DIAGNOSIS — Z0000 Encounter for general adult medical examination without abnormal findings: Secondary | ICD-10-CM | POA: Diagnosis not present

## 2023-09-14 DIAGNOSIS — K449 Diaphragmatic hernia without obstruction or gangrene: Secondary | ICD-10-CM | POA: Diagnosis not present

## 2023-09-14 DIAGNOSIS — N281 Cyst of kidney, acquired: Secondary | ICD-10-CM | POA: Diagnosis not present

## 2023-09-14 DIAGNOSIS — I7143 Infrarenal abdominal aortic aneurysm, without rupture: Secondary | ICD-10-CM | POA: Diagnosis not present

## 2023-09-14 DIAGNOSIS — K5730 Diverticulosis of large intestine without perforation or abscess without bleeding: Secondary | ICD-10-CM | POA: Diagnosis not present

## 2023-09-14 MED ORDER — IOHEXOL 300 MG/ML  SOLN
100.0000 mL | Freq: Once | INTRAMUSCULAR | Status: AC | PRN
  Administered 2023-09-14: 100 mL via INTRAVENOUS

## 2023-10-17 ENCOUNTER — Ambulatory Visit (INDEPENDENT_AMBULATORY_CARE_PROVIDER_SITE_OTHER): Payer: PRIVATE HEALTH INSURANCE | Admitting: Vascular Surgery

## 2023-10-17 ENCOUNTER — Encounter (INDEPENDENT_AMBULATORY_CARE_PROVIDER_SITE_OTHER): Payer: Self-pay | Admitting: Vascular Surgery

## 2023-10-17 VITALS — BP 136/78 | HR 67 | Ht 74.0 in | Wt 172.0 lb

## 2023-10-17 DIAGNOSIS — I7143 Infrarenal abdominal aortic aneurysm, without rupture: Secondary | ICD-10-CM | POA: Diagnosis not present

## 2023-10-17 DIAGNOSIS — I10 Essential (primary) hypertension: Secondary | ICD-10-CM | POA: Diagnosis not present

## 2023-10-17 DIAGNOSIS — I6501 Occlusion and stenosis of right vertebral artery: Secondary | ICD-10-CM

## 2023-10-17 DIAGNOSIS — I714 Abdominal aortic aneurysm, without rupture, unspecified: Secondary | ICD-10-CM | POA: Insufficient documentation

## 2023-10-17 DIAGNOSIS — E785 Hyperlipidemia, unspecified: Secondary | ICD-10-CM

## 2023-10-17 DIAGNOSIS — I6509 Occlusion and stenosis of unspecified vertebral artery: Secondary | ICD-10-CM | POA: Insufficient documentation

## 2023-10-17 NOTE — Assessment & Plan Note (Signed)
 lipid control important in reducing the progression of atherosclerotic disease. Continue statin therapy

## 2023-10-17 NOTE — Assessment & Plan Note (Signed)
blood pressure control important in reducing the progression of atherosclerotic disease and aneurysmal disease. On appropriate oral medications.  

## 2023-10-17 NOTE — Assessment & Plan Note (Signed)
 His carotid duplex showed atherosclerotic plaque in the carotid arteries without any significant stenosis.  The left vertebral artery had normal antegrade flow.  The right vertebral artery had antegrade flow in diastole with late reversal in the systolic phase.  This prompted question about subclavian artery disease.  The subclavian arteries themselves were not imaged on this study as best I can tell. Interestingly, with normal equal blood pressures and strong radial pulses I think this is likely more of proximal vertebral lesion than a subclavian lesion.  These would not generally be amenable to any sort of intervention or surgery.  Since less likely to be the cause of his dizziness although I cannot say entirely not possible.  Plan follow-up duplex in our office where we can also interrogate the subclavian arteries better in the coming months.

## 2023-10-17 NOTE — Progress Notes (Signed)
 Patient ID: Alex Malone, male   DOB: 01/04/1934, 89 y.o.   MRN: [id number]  Chief Complaint  Patient presents with   np. consult. MD ONLY evaluate subclavian artery stenosis. c    HPI Alex Malone is a 89 y.o. male.  I am asked to see the patient by Lauraine Rocks for evaluation of abnormal vertebral artery flow seen on duplex earlier this year.  Patient has been having persistent dizziness for about 6 months now.  He was seen ENT and had an extensive workup for vertigo.  He has seen neurology who ordered an MRI of the brain which I have independently reviewed which showed only some chronic small vessel ischemic changes.  His carotid duplex showed atherosclerotic plaque in the carotid arteries without any significant stenosis.  The left vertebral artery had normal antegrade flow.  The right vertebral artery had antegrade flow in diastole with late reversal in the systolic phase.  This prompted question about subclavian artery disease.  The subclavian arteries themselves were not imaged on this study as best I can tell.  He has normal blood pressures on each side without any blood pressure differences and strong radial pulses.  He has never had any right arm claudication or pain.   The patient also has an abdominal aortic aneurysm and left common iliac artery aneurysm that I saw when I reviewed his CT scan of the abdomen pelvis from earlier this year.  The abdominal aorta measures approximately 3.5 cm in maximal diameter and the left common iliac artery measures approximately 2.5 cm in maximal diameter.  He has very advanced atherosclerotic changes throughout his aorta, branch vessels, iliac arteries, and common femoral arteries.  He denies any lifestyle limiting claudication, ischemic rest pain, or ulceration.  No obvious aneurysm related symptoms. Specifically, the patient denies new back or abdominal pain, or signs of peripheral embolization    Past Medical History:  Diagnosis Date   Anxiety     History of lacunar cerebrovascular accident (CVA)    History of prostate cancer    History of subdural hematoma    HOH (hard of hearing)    Hyperlipidemia    Hyperlipidemia, unspecified 11/25/2012   Hypertension    Hyperuricemia     Past Surgical History:  Procedure Laterality Date   BACK SURGERY     CATARACT EXTRACTION W/PHACO Left 06/22/2020   Procedure: CATARACT EXTRACTION PHACO AND INTRAOCULAR LENS PLACEMENT (IOC) LEFT AHMED TUBE SHUNT W/TUTOPLAST 3.04 00:26.7;  Surgeon: Myrna Adine Anes, MD;  Location: [facility];  Service: Ophthalmology;  Laterality: Left;  90 minute case per Myla   CHOLECYSTECTOMY     EVALUATION UNDER ANESTHESIA WITH HEMORRHOIDECTOMY     HERNIA REPAIR     INGUINAL HERNIA REPAIR Right 09/12/2022   Procedure: HERNIA REPAIR INGUINAL ADULT;  Surgeon: Rodolph Romano, MD;  Location: [facility];  Service: General;  Laterality: Right;   INSERTION OF MESH N/A 07/11/2017   Procedure: INSERTION OF MESH;  Surgeon: Wonda Charlie BRAVO, MD;  Location: [facility];  Service: General;  Laterality: N/A;   INSERTION OF MESH Right 09/12/2022   Procedure: INSERTION OF MESH;  Surgeon: Rodolph Romano, MD;  Location: [facility];  Service: General;  Laterality: Right;   PERINEAL PROSTATECTOMY     subdermal hematoma     VENTRAL HERNIA REPAIR N/A 07/11/2017   Procedure: HERNIA REPAIR RECURRENT VENTRAL ADULT;  Surgeon: Wonda Charlie BRAVO, MD;  Location: [facility];  Service: General;  Laterality: N/A;     Family  History  Problem Relation Age of Onset   Kidney disease Mother    Cancer Father   No bleeding or clotting disorders   Social History   Tobacco Use   Smoking status: Never   Smokeless tobacco: Never  Vaping Use   Vaping status: Never Used  Substance Use Topics   Alcohol  use: Not Currently   Drug use: No    No Known Allergies  Current Outpatient Medications  Medication Sig Dispense Refill   acetaminophen  (TYLENOL ) 325 MG tablet Take 650 mg by mouth  every 6 (six) hours as needed.     ASPIRIN 81 PO Take by mouth daily.     aspirin EC 325 MG tablet Take 325 mg by mouth daily. Not taking until after surgery     brimonidine  (ALPHAGAN ) 0.2 % ophthalmic solution Place 1 drop into both eyes 2 (two) times daily.  5   diazepam  (VALIUM ) 2 MG tablet Take 2 mg by mouth every 6 (six) hours as needed for anxiety.     dorzolamide  (TRUSOPT ) 2 % ophthalmic solution 1 drop.     fluticasone (FLONASE) 50 MCG/ACT nasal spray Place into both nostrils daily.     latanoprost  (XALATAN ) 0.005 % ophthalmic solution 1 drop at bedtime.     lisinopril  (PRINIVIL ,ZESTRIL ) 10 MG tablet Take 10 mg by mouth daily.     Meclizine  HCl 25 MG CHEW Chew 1 tablet by mouth daily.     senna (SENOKOT) 8.6 MG tablet Take 1 tablet by mouth daily.     sertraline (ZOLOFT) 25 MG tablet Take 25 mg by mouth daily. Will start until after surgery     simvastatin  (ZOCOR ) 20 MG tablet Take 20 mg by mouth at bedtime.      timolol  (BETIMOL ) 0.5 % ophthalmic solution 1 drop.     No current facility-administered medications for this visit.      REVIEW OF SYSTEMS (Negative unless checked)  Constitutional: [] Weight loss  [] Fever  [] Chills Cardiac: [] Chest pain   [] Chest pressure   [] Palpitations   [] Shortness of breath when laying flat   [] Shortness of breath at rest   [] Shortness of breath with exertion. Vascular:  [] Pain in legs with walking   [] Pain in legs at rest   [] Pain in legs when laying flat   [] Claudication   [] Pain in feet when walking  [] Pain in feet at rest  [] Pain in feet when laying flat   [] History of DVT   [] Phlebitis   [] Swelling in legs   [] Varicose veins   [] Non-healing ulcers Pulmonary:   [] Uses home oxygen   [] Productive cough   [] Hemoptysis   [] Wheeze  [] COPD   [] Asthma Neurologic:  [x] Dizziness  [] Blackouts   [] Seizures   [x] History of stroke   [] History of TIA  [] Aphasia   [] Temporary blindness   [] Dysphagia   [] Weakness or numbness in arms   [] Weakness or numbness in  legs Musculoskeletal:  [] Arthritis   [] Joint swelling   [] Joint pain   [] Low back pain Hematologic:  [] Easy bruising  [] Easy bleeding   [] Hypercoagulable state   [] Anemic  [] Hepatitis Gastrointestinal:  [] Blood in stool   [] Vomiting blood  [] Gastroesophageal reflux/heartburn   [] Abdominal pain Genitourinary:  [] Chronic kidney disease   [] Difficult urination  [] Frequent urination  [] Burning with urination   [] Hematuria Skin:  [] Rashes   [] Ulcers   [] Wounds Psychological:  [x] History of anxiety   []  History of major depression.    Physical Exam BP 136/78   Pulse 67  Ht 6' 2 (1.88 m)   Wt 172 lb (78 kg)   BMI 22.08 kg/m  Gen:  WD/WN, NAD.  Appears younger than stated age Head: [place]/AT, No temporalis wasting.  Ear/Nose/Throat: Hearing grossly intact, nares w/o erythema or drainage, oropharynx w/o Erythema/Exudate Eyes: Conjunctiva clear, sclera non-icteric  Neck: trachea midline.  No JVD.  Pulmonary:  Good air movement, respirations not labored, no use of accessory muscles  Cardiac: RRR, no JVD Vascular:  Vessel Right Left  Radial 2+ Palpable 2+ Palpable                                   Gastrointestinal:. No masses, surgical incisions, or scars. Musculoskeletal: M/S 5/5 throughout.  Extremities without ischemic changes.  No deformity or atrophy. No edema. Neurologic: Sensation grossly intact in extremities.  Symmetrical.  Speech is fluent. Motor exam as listed above. Psychiatric: Judgment intact, Mood & affect appropriate for pt's clinical situation. Dermatologic: No rashes or ulcers noted.  No cellulitis or open wounds.    Radiology No results found.  Labs No results found for this or any previous visit (from the past 2160 hours).  Assessment/Plan:  AAA (abdominal aortic aneurysm) without rupture (HCC) I have reviewed his CT scan from earlier this year and he clearly has an approximately 3.5 cm abdominal aortic aneurysm and a 2.5 cm left common iliac artery  aneurysm.  This is associated with advanced atherosclerotic changes of the aorta, iliac arteries, femoral arteries, and branch vessels.  No role for intervention at this size.  Would recommend rechecking this when he follows up with his carotid duplex in 6 months.  Hypertension blood pressure control important in reducing the progression of atherosclerotic disease and aneurysmal disease. On appropriate oral medications.   Occlusion and stenosis of vertebral artery His carotid duplex showed atherosclerotic plaque in the carotid arteries without any significant stenosis.  The left vertebral artery had normal antegrade flow.  The right vertebral artery had antegrade flow in diastole with late reversal in the systolic phase.  This prompted question about subclavian artery disease.  The subclavian arteries themselves were not imaged on this study as best I can tell. Interestingly, with normal equal blood pressures and strong radial pulses I think this is likely more of proximal vertebral lesion than a subclavian lesion.  These would not generally be amenable to any sort of intervention or surgery.  Since less likely to be the cause of his dizziness although I cannot say entirely not possible.  Plan follow-up duplex in our office where we can also interrogate the subclavian arteries better in the coming months.  Hyperlipidemia, unspecified lipid control important in reducing the progression of atherosclerotic disease. Continue statin therapy       Selinda Gu 10/17/2023, 4:23 PM   This note was created with Dragon medical transcription system.  Any errors from dictation are unintentional.

## 2023-10-17 NOTE — Assessment & Plan Note (Signed)
 I have reviewed his CT scan from earlier this year and he clearly has an approximately 3.5 cm abdominal aortic aneurysm and a 2.5 cm left common iliac artery aneurysm.  This is associated with advanced atherosclerotic changes of the aorta, iliac arteries, femoral arteries, and branch vessels.  No role for intervention at this size.  Would recommend rechecking this when he follows up with his carotid duplex in 6 months.

## 2024-01-11 DIAGNOSIS — R42 Dizziness and giddiness: Secondary | ICD-10-CM | POA: Diagnosis not present

## 2024-02-21 DIAGNOSIS — R42 Dizziness and giddiness: Secondary | ICD-10-CM | POA: Diagnosis not present

## 2024-02-26 DIAGNOSIS — H401133 Primary open-angle glaucoma, bilateral, severe stage: Secondary | ICD-10-CM | POA: Diagnosis not present

## 2024-02-26 DIAGNOSIS — H1013 Acute atopic conjunctivitis, bilateral: Secondary | ICD-10-CM | POA: Diagnosis not present

## 2024-04-19 ENCOUNTER — Encounter (INDEPENDENT_AMBULATORY_CARE_PROVIDER_SITE_OTHER): Payer: PRIVATE HEALTH INSURANCE

## 2024-04-19 ENCOUNTER — Other Ambulatory Visit (INDEPENDENT_AMBULATORY_CARE_PROVIDER_SITE_OTHER): Payer: PRIVATE HEALTH INSURANCE

## 2024-04-19 ENCOUNTER — Ambulatory Visit (INDEPENDENT_AMBULATORY_CARE_PROVIDER_SITE_OTHER): Payer: PRIVATE HEALTH INSURANCE | Admitting: Vascular Surgery
